# Patient Record
Sex: Female | Born: 2000 | Race: White | Hispanic: No | Marital: Single | Smoking: Former smoker
Health system: Southern US, Community
[De-identification: ages and names within clinical notes are randomized; demographics above are authoritative.]

## PROBLEM LIST (undated history)

## (undated) DIAGNOSIS — J301 Allergic rhinitis due to pollen: Secondary | ICD-10-CM

## (undated) DIAGNOSIS — R109 Unspecified abdominal pain: Secondary | ICD-10-CM

## (undated) DIAGNOSIS — R768 Other specified abnormal immunological findings in serum: Secondary | ICD-10-CM

## (undated) DIAGNOSIS — F32A Depression, unspecified: Secondary | ICD-10-CM

## (undated) DIAGNOSIS — F419 Anxiety disorder, unspecified: Secondary | ICD-10-CM

## (undated) DIAGNOSIS — T7840XA Allergy, unspecified, initial encounter: Secondary | ICD-10-CM

## (undated) HISTORY — DX: Other specified abnormal immunological findings in serum: R76.8

## (undated) HISTORY — DX: Unspecified abdominal pain: R10.9

## (undated) HISTORY — DX: Allergy, unspecified, initial encounter: T78.40XA

## (undated) HISTORY — DX: Allergic rhinitis due to pollen: J30.1

---
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
## 2021-02-25 ENCOUNTER — Telehealth: Payer: No Typology Code available for payment source | Admitting: Family

## 2021-03-03 ENCOUNTER — Telehealth: Payer: Self-pay

## 2021-03-03 NOTE — Telephone Encounter (Signed)
Lvm to reschedule recent follow up that was a no show with adolescent medicine FNP.

## 2021-03-04 ENCOUNTER — Emergency Department (HOSPITAL_BASED_OUTPATIENT_CLINIC_OR_DEPARTMENT_OTHER)
Admission: EM | Admit: 2021-03-04 | Discharge: 2021-03-05 | Disposition: A | Discharge status: Home / Self Care | Payer: No Typology Code available for payment source | Attending: Emergency Medicine | Admitting: Emergency Medicine

## 2021-03-04 ENCOUNTER — Other Ambulatory Visit: Payer: Self-pay

## 2021-03-04 ENCOUNTER — Encounter (HOSPITAL_BASED_OUTPATIENT_CLINIC_OR_DEPARTMENT_OTHER): Payer: Self-pay | Admitting: Emergency Medicine

## 2021-03-04 DIAGNOSIS — R519 Headache, unspecified: Secondary | ICD-10-CM | POA: Diagnosis present

## 2021-03-04 DIAGNOSIS — N9489 Other specified conditions associated with female genital organs and menstrual cycle: Secondary | ICD-10-CM | POA: Insufficient documentation

## 2021-03-04 MED ORDER — SODIUM CHLORIDE 0.9 % IV BOLUS
1000.0000 mL | Freq: Once | INTRAVENOUS | Status: AC
  Administered 2021-03-05: 1000 mL via INTRAVENOUS

## 2021-03-04 MED ORDER — METOCLOPRAMIDE HCL 5 MG/ML IJ SOLN
10.0000 mg | Freq: Once | INTRAMUSCULAR | Status: AC
  Administered 2021-03-05: 10 mg via INTRAVENOUS
  Filled 2021-03-04: qty 2

## 2021-03-04 MED ORDER — DEXAMETHASONE SODIUM PHOSPHATE 10 MG/ML IJ SOLN
10.0000 mg | Freq: Once | INTRAMUSCULAR | Status: AC
  Administered 2021-03-05: 10 mg via INTRAVENOUS
  Filled 2021-03-04: qty 1

## 2021-03-04 MED ORDER — DIPHENHYDRAMINE HCL 50 MG/ML IJ SOLN
25.0000 mg | Freq: Once | INTRAMUSCULAR | Status: AC
  Administered 2021-03-05: 25 mg via INTRAVENOUS
  Filled 2021-03-04: qty 1

## 2021-03-04 MED ORDER — KETOROLAC TROMETHAMINE 30 MG/ML IJ SOLN
30.0000 mg | Freq: Once | INTRAMUSCULAR | Status: AC
  Administered 2021-03-05: 30 mg via INTRAVENOUS
  Filled 2021-03-04: qty 1

## 2021-03-04 NOTE — ED Triage Notes (Signed)
Presents for bilateral temporal and occipital HA (8/10) x 10 days. Has been to PCP who prescribed amoxicillin for sinus infxn, and later wrote referral for neuro. Unable to obtain neuro appt for additional 2 weeks. Endorses photophobia.  Denies, fever, chills, N/V/D, vision change.  Has tried tylenol, not on any medications for migraines, not formally diagnosed with this.

## 2021-03-04 NOTE — ED Provider Notes (Signed)
[facility] EMERGENCY DEPT Provider Note   CSN: [id number] Arrival date & time: 03/04/21  2236     History  Chief Complaint  Patient presents with   Headache    Kimberly Nichols is a 21 y.o. female.  Patient is a 21 year old female with no significant past medical history.  She presents with a 1 week history of persistent headache.  She describes pain to both temples that radiates to the back of her head.  This has been constant and began in the absence of any injury or trauma.  She denies any blurry vision, but does report photophobia.  She denies any weakness or numbness.  She denies any fevers, chills, or stiff neck.  She has tried taking Tylenol and over-the-counter medications with little relief.  She was seen by her primary doctor and prescribed amoxicillin for possible sinus infection, however this did not help.  She has been referred to neurology, however this appointment is unable to take place for another 2 weeks.  The history is provided by the patient.  Headache Pain location:  L temporal and R temporal Quality:  Dull Onset quality:  Gradual Duration:  1 week Timing:  Constant Progression:  Worsening Chronicity:  New Relieved by:  Nothing Worsened by:  Nothing Ineffective treatments:  Acetaminophen Associated symptoms: no fever       Home Medications Prior to Admission medications   Medication Sig Start Date End Date Taking? Authorizing Provider  DULoxetine (CYMBALTA) 20 MG capsule Take 1 capsule (20 mg total) by mouth daily. 11/28/20   Georges Mouse, NP      Allergies    Clarithromycin    Review of Systems   Review of Systems  Constitutional:  Negative for fever.  Neurological:  Positive for headaches.  All other systems reviewed and are negative.  Physical Exam Updated Vital Signs BP 114/80    Pulse 95    Temp 99.2 F (37.3 C)    Resp 16    Ht 5\' 7"  (1.702 m)    Wt 77.1 kg    LMP 02/22/2021 (Exact Date)    SpO2 98%    BMI 26.63 kg/m   Physical Exam Vitals and nursing note reviewed.  Constitutional:      General: She is not in acute distress.    Appearance: She is well-developed. She is not diaphoretic.  HENT:     Head: Normocephalic and atraumatic.  Eyes:     General: No visual field deficit.    Extraocular Movements: Extraocular movements intact.     Pupils: Pupils are equal, round, and reactive to light.  Cardiovascular:     Rate and Rhythm: Normal rate and regular rhythm.     Heart sounds: No murmur heard.   No friction rub. No gallop.  Pulmonary:     Effort: Pulmonary effort is normal. No respiratory distress.     Breath sounds: Normal breath sounds. No wheezing.  Abdominal:     General: Bowel sounds are normal. There is no distension.     Palpations: Abdomen is soft.     Tenderness: There is no abdominal tenderness.  Musculoskeletal:        General: Normal range of motion.     Cervical back: Normal range of motion and neck supple.  Skin:    General: Skin is warm and dry.  Neurological:     General: No focal deficit present.     Mental Status: She is alert and oriented to person, place, and time.  Cranial Nerves: No cranial nerve deficit or facial asymmetry.    ED Results / Procedures / Treatments   Labs (all labs ordered are listed, but only abnormal results are displayed) Labs Reviewed  BASIC METABOLIC PANEL  CBC WITH DIFFERENTIAL/PLATELET  HCG, SERUM, QUALITATIVE    EKG None  Radiology No results found.  Procedures Procedures    Medications Ordered in ED Medications  sodium chloride 0.9 % bolus 1,000 mL (has no administration in time range)  ketorolac (TORADOL) 30 MG/ML injection 30 mg (has no administration in time range)  diphenhydrAMINE (BENADRYL) injection 25 mg (has no administration in time range)  metoCLOPramide (REGLAN) injection 10 mg (has no administration in time range)  dexamethasone (DECADRON) injection 10 mg (has no administration in time range)    ED Course/  Medical Decision Making/ A&P  Patient presenting with complaints of headache as described in the HPI.  She is neurologically intact and head CT is negative.  She is feeling better after migraine cocktail.  At this point, I doubt an emergent situation and do not feel as though LP is indicated.  Patient to be discharged with Tylenol/ibuprofen and follow-up with her primary doctor/neurologist as scheduled.  Final Clinical Impression(s) / ED Diagnoses Final diagnoses:  None    Rx / DC Orders ED Discharge Orders     None         Geoffery Lyons, MD 03/05/21 [phone]

## 2021-03-05 ENCOUNTER — Emergency Department (HOSPITAL_BASED_OUTPATIENT_CLINIC_OR_DEPARTMENT_OTHER): Payer: No Typology Code available for payment source

## 2021-03-05 LAB — HCG, SERUM, QUALITATIVE: Preg, Serum: NEGATIVE

## 2021-03-05 LAB — CBC WITH DIFFERENTIAL/PLATELET
Abs Immature Granulocytes: 0.02 10*3/uL (ref 0.00–0.07)
Basophils Absolute: 0 10*3/uL (ref 0.0–0.1)
Basophils Relative: 0 %
Eosinophils Absolute: 0.4 10*3/uL (ref 0.0–0.5)
Eosinophils Relative: 5 %
HCT: 36.9 % (ref 36.0–46.0)
Hemoglobin: 12.1 g/dL (ref 12.0–15.0)
Immature Granulocytes: 0 %
Lymphocytes Relative: 47 %
Lymphs Abs: 4.2 10*3/uL — ABNORMAL HIGH (ref 0.7–4.0)
MCH: 29.8 pg (ref 26.0–34.0)
MCHC: 32.8 g/dL (ref 30.0–36.0)
MCV: 90.9 fL (ref 80.0–100.0)
Monocytes Absolute: 1.1 10*3/uL — ABNORMAL HIGH (ref 0.1–1.0)
Monocytes Relative: 12 %
Neutro Abs: 3.3 10*3/uL (ref 1.7–7.7)
Neutrophils Relative %: 36 %
Platelets: 249 10*3/uL (ref 150–400)
RBC: 4.06 MIL/uL (ref 3.87–5.11)
RDW: 12 % (ref 11.5–15.5)
WBC: 9 10*3/uL (ref 4.0–10.5)
nRBC: 0 % (ref 0.0–0.2)

## 2021-03-05 LAB — BASIC METABOLIC PANEL
Anion gap: 7 (ref 5–15)
BUN: 17 mg/dL (ref 6–20)
CO2: 26 mmol/L (ref 22–32)
Calcium: 9.7 mg/dL (ref 8.9–10.3)
Chloride: 106 mmol/L (ref 98–111)
Creatinine, Ser: 0.76 mg/dL (ref 0.44–1.00)
GFR, Estimated: >60 mL/min (ref >60)
Glucose, Bld: 92 mg/dL (ref 70–99)
Potassium: 3.8 mmol/L (ref 3.5–5.1)
Sodium: 139 mmol/L (ref 135–145)

## 2021-03-05 NOTE — Discharge Instructions (Signed)
Take Tylenol 1000 mg rotated with ibuprofen 600 mg every 4 hours as needed for pain.    Follow-up with your primary doctor/neurologist as scheduled, and return to the ER if symptoms significantly worsen or change.

## 2022-01-04 ENCOUNTER — Telehealth: Payer: Self-pay | Admitting: Family Medicine

## 2022-01-04 NOTE — Telephone Encounter (Signed)
Pt's Mother- Dagmar Hait - current Pt of Dr. Caryl Never) called to ask if MD would be willing to take her daughter on as a new patient?  Pt has [employer] education [employer].

## 2022-01-05 NOTE — Telephone Encounter (Signed)
Pt will be here on 01/11/22

## 2022-01-11 ENCOUNTER — Ambulatory Visit (INDEPENDENT_AMBULATORY_CARE_PROVIDER_SITE_OTHER): Payer: No Typology Code available for payment source | Admitting: Family Medicine

## 2022-01-11 ENCOUNTER — Encounter: Payer: Self-pay | Admitting: Family Medicine

## 2022-01-11 VITALS — BP 110/62 | HR 74 | Temp 98.0°F | Ht 67.72 in | Wt 189.2 lb

## 2022-01-11 DIAGNOSIS — R635 Abnormal weight gain: Secondary | ICD-10-CM

## 2022-01-11 DIAGNOSIS — M545 Low back pain, unspecified: Secondary | ICD-10-CM

## 2022-01-11 DIAGNOSIS — F339 Major depressive disorder, recurrent, unspecified: Secondary | ICD-10-CM | POA: Diagnosis not present

## 2022-01-11 NOTE — Progress Notes (Unsigned)
21 y.o. No obstetric history on file. Single Caucasian female here for NEW GYN/annual exam.  Pt wants to discuss birth control options.   Cramping with the last 4 cycles, severe for first day or two associated with heavy bleeding. Has a headache before her cycle starts.   Sexually active.  Using condoms.   Also having acne which she would like to control.  Denies HTN, migraine with aura, liver or breast disease, personal or family history of thromboembolic events.   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   Had negative of GC/CT from urine with [facility] pediatrics last week.  She states it was negative.   PCP:   Dr. Caryl Never  Patient's last menstrual period was 01/14/2022.     Period Cycle (Days): 28 Period Duration (Days): 4 Period Pattern: Regular Menstrual Flow: Moderate Menstrual Control: Tampon Dysmenorrhea: (!) Severe (Severed first two days, Pt says this started recently)     Sexually active: Yes.    The current method of family planning is none.    Exercising: Yes.    Gym/ health club routine includes light weights. Smoker:  no  Health Maintenance: Pap:  n/a History of abnormal Pap:  no MMG:  n/a Colonoscopy:  n/a BMD:   n/a  Result  n/a TDaP:  pt is unsure when but she has had it done Gardasil:   yes, completed HIV: n/a Hep C: n/a Screening Labs:  Hb today: ***, Urine today: ***   reports that she has quit smoking. Her smoking use included e-cigarettes. She has never used smokeless tobacco. She reports that she does not drink alcohol and does not use drugs.  Past Medical History:  Diagnosis Date   Abdominal pain, recurrent    Allergy    Anxiety    Depression    Hay fever    Helicobacter pylori antibody above reference range     Past Surgical History:  Procedure Laterality Date   TONSILLECTOMY AND ADENOIDECTOMY N/A 10/05/2019   Procedure: TONSILLECTOMY AND  ADENOIDECTOMY;  Surgeon: Newman Pies, MD;  Location: [facility];  Service: ENT;  Laterality: N/A;    Current Outpatient Medications  Medication Sig Dispense Refill   DULoxetine (CYMBALTA) 20 MG capsule Take 1 capsule (20 mg total) by mouth daily. (Patient not taking: Reported on 01/11/2022) 90 capsule 1   No current facility-administered medications for this visit.    Family History  Problem Relation Age of Onset   Anxiety disorder Mother    COPD Maternal Grandmother    Cancer Maternal Grandmother    Stroke Maternal Grandfather    Depression Maternal Grandfather    Heart attack Maternal Grandfather     Review of Systems  All other systems reviewed and are negative.   Exam:   BP 122/80 (BP Location: Right Arm, Patient Position: Sitting, Cuff Size: Normal)   Pulse 74   Ht 5' 6.75" (1.695 m)   Wt 190 lb (86.2 kg)   LMP 01/14/2022   SpO2 100%   BMI 29.98 kg/m     General appearance: alert, cooperative and appears stated age Head: normocephalic, without obvious abnormality, atraumatic Neck: no adenopathy, supple, symmetrical, trachea midline and thyroid normal to inspection and palpation Lungs: clear to auscultation bilaterally Breasts: normal appearance, no masses or tenderness, No nipple retraction or dimpling, No nipple discharge or bleeding, No axillary adenopathy Heart: regular rate and rhythm Abdomen: soft, non-tender; no masses,  no organomegaly Extremities: extremities normal, atraumatic, no cyanosis or edema Skin: skin color, texture, turgor normal. No rashes or lesions Lymph nodes: cervical, supraclavicular, and axillary nodes normal. Neurologic: grossly normal  Pelvic: External genitalia:  no lesions              No abnormal inguinal nodes palpated.              Urethra:  normal appearing urethra with no masses, tenderness or lesions              Bartholins and Skenes: normal                 Vagina: normal appearing vagina with normal color and  discharge, no lesions              Cervix: no lesions              Pap taken: {yes no:314532} Bimanual Exam:  Uterus:  normal size, contour, position, consistency, mobility, non-tender              Adnexa: no mass, fullness, tenderness              Rectal exam: {yes no:314532}.  Confirms.              Anus:  normal sphincter tone, no lesions  Chaperone was present for exam:  ***  Assessment:   Well woman visit with gynecologic exam.   Plan: Mammogram screening discussed. Self breast awareness reviewed. Pap and HR HPV as above. Guidelines for Calcium, Vitamin D, regular exercise program including cardiovascular and weight bearing exercise. We discussed pills, patch, rings, and IUDs.  We focused on the [place] IUD. STD screening.   Follow up annually and prn.   Additional counseling given.  {yes [id number]. _______ minutes face to face time of which over 50% was spent in counseling.    After visit summary provided.

## 2022-01-11 NOTE — Progress Notes (Signed)
Established Patient Office Visit  Subjective   Patient ID: Kimberly Nichols, female    DOB: 2001-01-16  Age: 21 y.o. MRN: [id number]  Chief Complaint  Patient presents with   New Patient (Initial Visit)         HPI   [name] is seen to establish care.  Has previously been seen by [facility] pediatrics.  In fact, she was just seen there she states a week ago with UTI diagnosis.  She was treated with Septra DS and symptoms improved at this time.  She is sexually active with 1 partner.  She uses barrier protection.  She does recall that chlamydia testing was done which came back negative.  We reviewed her previous immunizations and these are all completely up-to-date with exception of flu vaccine this season which she declines.  She has history of recurrent depression.  Has been on Cymbalta until this past year.  She had tried multiple other antidepressants previously.  She states that she was on Cymbalta for anxiety and depression symptoms and that seemed to help but she feels stable currently off medication.  She has had some recent bilateral low back pain.  No radiculitis symptoms.  Denies any injury.  She just joined the gym and plans to start back regular exercise program soon.  She had some recent weight gain.  She plans to make some dietary changes as well.  She works at [employer] and frequently eats high fat foods there.  She is currently taking classes at [employer] college and trying to get into Armed forces operational officer school.  Past medical history otherwise significant for previous tonsillectomy 2021.  No other surgeries.  Reported allergy/intolerance to clarithromycin.  Takes no regular medications currently.  Family history significant for obesity in her mother.  No type 2 diabetes.  No premature heart disease.  She is non-smoker.  Past Medical History:  Diagnosis Date   Abdominal pain, recurrent    Allergy    Anxiety    Depression    Hay fever     Helicobacter pylori antibody above reference range    Past Surgical History:  Procedure Laterality Date   TONSILLECTOMY AND ADENOIDECTOMY N/A 10/05/2019   Procedure: TONSILLECTOMY AND ADENOIDECTOMY;  Surgeon: Newman Pies, MD;  Location: [facility];  Service: ENT;  Laterality: N/A;    reports that she has been smoking e-cigarettes. She has never used smokeless tobacco. She reports that she does not drink alcohol and does not use drugs. family history includes Anxiety disorder in her mother; COPD in her maternal grandmother; Cancer in her maternal grandmother; Depression in her maternal grandfather; Heart attack in her maternal grandfather; Stroke in her maternal grandfather. Allergies  Allergen Reactions   Clarithromycin     Review of Systems  Constitutional:  Negative for malaise/fatigue.  Eyes:  Negative for blurred vision.  Respiratory:  Negative for shortness of breath.   Cardiovascular:  Negative for chest pain.  Gastrointestinal:  Negative for abdominal pain.  Genitourinary:  Negative for dysuria.  Neurological:  Negative for dizziness, weakness and headaches.      Objective:     BP 110/62 (BP Location: Left Arm, Patient Position: Sitting, Cuff Size: Normal)   Pulse 74   Temp 98 F (36.7 C) (Oral)   Ht 5' 7.72" (1.72 m)   Wt 189 lb 3.2 oz (85.8 kg)   SpO2 99%   BMI 29.01 kg/m  BP Readings from Last 3 Encounters:  01/11/22 110/62  03/05/21 104/67  11/28/20 111/78   Wt Readings from Last 3 Encounters:  01/11/22 189 lb 3.2 oz (85.8 kg)  03/04/21 170 lb (77.1 kg) (92 %, Z= 1.38)*  11/28/20 168 lb 12.8 oz (76.6 kg) (91 %, Z= 1.37)*   * Growth percentiles are based on CDC (Girls, 2-20 Years) data.      Physical Exam Vitals reviewed.  Cardiovascular:     Rate and Rhythm: Normal rate and regular rhythm.     Heart sounds: No murmur heard. Pulmonary:     Effort: Pulmonary effort is normal.     Breath sounds: Normal breath sounds. No wheezing or rales.   Musculoskeletal:     Right lower leg: No edema.     Left lower leg: No edema.     Comments: Straight leg raises are negative bilaterally  Neurological:     Mental Status: She is alert.     Comments: Full strength lower extremities      No results found for any visits on 01/11/22.    The ASCVD Risk score (Arnett DK, et al., 2019) failed to calculate for the following reasons:   The 2019 ASCVD risk score is only valid for ages 46 to 57    Assessment & Plan:   #1 history of recurrent depression.  Currently stable off medications.  Has previously taken Cymbalta.  Be in touch for any recurrent symptoms  #2 recent weight gain.  Her weight has gone up substantially past couple years.  We discussed diet and exercise in some detail.  Scale back high caloric foods and establish consistent regular exercise.  Not having any success with the above consider at least checking thyroid functions  #3 reported recent UTI.  Patient treated with Septra.  STDs ruled out per patient.  Patient improved following antibiotics  #4 bilateral lumbar back pain.  Nonfocal exam.  Recommend core strengthening and some back stretches.  Consider trial of physical therapy if not improved in the next couple weeks  #5 health maintenance-she is scheduled to see GYN next week to discuss getting initial Pap smear and possibly birth control options.  Flu vaccine offered but declined.  Other immunizations up-to-date.  No follow-ups on file.    Evelena Peat, MD

## 2022-01-18 ENCOUNTER — Other Ambulatory Visit (HOSPITAL_COMMUNITY)
Admission: RE | Admit: 2022-01-18 | Discharge: 2022-01-18 | Disposition: A | Discharge status: Home / Self Care | Payer: No Typology Code available for payment source | Source: Ambulatory Visit | Attending: Obstetrics and Gynecology | Admitting: Obstetrics and Gynecology

## 2022-01-18 ENCOUNTER — Ambulatory Visit (INDEPENDENT_AMBULATORY_CARE_PROVIDER_SITE_OTHER): Payer: No Typology Code available for payment source | Admitting: Obstetrics and Gynecology

## 2022-01-18 ENCOUNTER — Encounter: Payer: Self-pay | Admitting: Obstetrics and Gynecology

## 2022-01-18 VITALS — BP 122/80 | HR 74 | Ht 66.75 in | Wt 190.0 lb

## 2022-01-18 DIAGNOSIS — Z308 Encounter for other contraceptive management: Secondary | ICD-10-CM

## 2022-01-18 DIAGNOSIS — Z113 Encounter for screening for infections with a predominantly sexual mode of transmission: Secondary | ICD-10-CM | POA: Insufficient documentation

## 2022-01-18 DIAGNOSIS — Z114 Encounter for screening for human immunodeficiency virus [HIV]: Secondary | ICD-10-CM | POA: Diagnosis not present

## 2022-01-18 DIAGNOSIS — Z01419 Encounter for gynecological examination (general) (routine) without abnormal findings: Secondary | ICD-10-CM

## 2022-01-18 DIAGNOSIS — Z1159 Encounter for screening for other viral diseases: Secondary | ICD-10-CM

## 2022-01-18 MED ORDER — MISOPROSTOL 200 MCG PO TABS: ORAL_TABLET | ORAL | 0 refills | Status: DC

## 2022-01-18 NOTE — Patient Instructions (Signed)
Intrauterine Device You will learn what IUDs are, their length of use, and side effects. To view the content, go to this web address: [email or web address]  This video will expire on: 10/14/2023. If you need access to this video following this date, please reach out to the healthcare provider who assigned it to you. This information is not intended to replace advice given to you by your health care provider. Make sure you discuss any questions you have with your health care provider. Elsevier Patient Education  2023 Elsevier Inc.   Intrauterine Device Information An intrauterine device (IUD) is a medical device that is inserted into the uterus to prevent pregnancy. It is a small, T-shaped device that has one or two nylon strings hanging down from it. The strings hang out of the lower part of the uterus (cervix) to allow for future IUD removal. There are two types of IUDs: Hormone IUD. This type of IUD is made of plastic and contains the hormone progestin (synthetic progesterone). A hormone IUD may last 3-5 years. Copper IUD. This type of IUD has copper wire wrapped around it. A copper IUD may last up to 10 years. How is an IUD inserted? An IUD is inserted through the vagina, through the cervix, and into the uterus with a minor medical procedure. The procedure for IUD insertion may vary among health care providers and hospitals. How does an IUD work? Synthetic progesterone in a hormonal IUD prevents pregnancy by: Thickening cervical mucus to prevent sperm from entering the uterus. Thinning the uterine lining to prevent a fertilized egg from being implanted there. Copper in a copper IUD prevents pregnancy by making the uterus and fallopian tubes produce a fluid that kills sperm. What are the advantages of an IUD? Advantages of either type of IUD An IUD: Is highly effective in preventing pregnancy. Is reversible. You can become pregnant shortly after the IUD is removed. Is  low-maintenance and can stay in place for a long time. Has no estrogen-related side effects. Can be used when breastfeeding. Is not associated with weight gain. Can be inserted right after childbirth, an abortion, or a miscarriage. Advantages of a hormone IUD If it is inserted within 7 days of your period starting, it works right after it has been inserted. If the hormone IUD is inserted at any other time in your cycle, you will need to use a backup method of birth control for 7 days after insertion. It can make menstrual periods lighter or stop completely. It can reduce menstrual cramping and other discomforts from menstrual periods. It can be used for 3-5 years, depending on which IUD you have. Advantages of a copper IUD It works right after it is inserted. It can be used as a form of emergency birth control if it is inserted within 5 days after having unprotected sex. It does not interfere with your body's natural hormones. It can be used for up to 10 years. What are the disadvantages of an IUD? An IUD may cause irregular menstrual bleeding for a period of time after insertion. It is common to have pain during insertion and have cramping and vaginal bleeding after insertion. An IUD may cut the uterus (uterine perforation) when it is inserted. This is rare. Pelvic inflammatory disease (PID) may happen after insertion of an IUD. PID is an infection in the uterus and fallopian tubes. The IUD does not cause the infection. The infection is usually from an unknown sexually transmitted infection (STI). This is rare, and it usually happens  during the first 20 days after the IUD is inserted. A copper IUD can make your menstrual flow heavier and more painful. IUDs cannot prevent sexually transmitted infections (STIs). How is an IUD removed?  You will lie on your back with your knees bent and your feet in footrests (stirrups). A device will be inserted into your vagina to spread apart the vaginal  walls (speculum). This will allow your health care provider to see the strings attached to the IUD. Your health care provider will use a small instrument (forceps) to grasp the IUD strings and will pull firmly until the IUD is removed. You may have some discomfort when the IUD is removed. Your health care provider may recommend taking over-the-counter pain relievers, such as ibuprofen, before the procedure. You may also have minor spotting for a few days after the procedure. The procedure for IUD removal may vary among health care providers and hospitals. Is an IUD right for me? If you are interested in an IUD, discuss it with your health care provider. He or she will make sure you are a good candidate for an IUD and will let you know more about the advantages, disadvantage, and possible side effects. This will allow you to make a decision about the device. Summary An intrauterine device (IUD) is a medical device that is inserted in the uterus to prevent pregnancy. It is a small, T-shaped device that has one or two nylon strings hanging down from it. A hormone IUD contains the hormone progestin (synthetic progesterone). A copper IUD has copper wire wrapped around it. Synthetic progesterone in a hormone IUD prevents pregnancy by thickening cervical mucus and thinning the walls of the uterus. Copper in a copper IUD prevents pregnancy by making the uterus and fallopian tubes produce a fluid that kills sperm. A hormone IUD can be left in place for 3-5 years. A copper IUD can be left in place for up to 10 years. An IUD is inserted and removed by a health care provider. You may feel some pain during insertion and removal. Your health care provider may recommend taking over-the-counter pain medicine, such as ibuprofen, before an IUD procedure. This information is not intended to replace advice given to you by your health care provider. Make sure you discuss any questions you have with your health care  provider. Document Revised: 08/08/2019 Document Reviewed: 08/08/2019 Elsevier Patient Education  2023 Elsevier Inc.   EXERCISE AND DIET:  We recommended that you start or continue a regular exercise program for good health. Regular exercise means any activity that makes your heart beat faster and makes you sweat.  We recommend exercising at least 30 minutes per day at least 3 days a week, preferably 4 or 5.  We also recommend a diet low in fat and sugar.  Inactivity, poor dietary choices and obesity can cause diabetes, heart attack, stroke, and kidney damage, among others.    ALCOHOL AND SMOKING:  Women should limit their alcohol intake to no more than 7 drinks/beers/glasses of wine (combined, not each!) per week. Moderation of alcohol intake to this level decreases your risk of breast cancer and liver damage. And of course, no recreational drugs are part of a healthy lifestyle.  And absolutely no smoking or even second hand smoke. Most people know smoking can cause heart and lung diseases, but did you know it also contributes to weakening of your bones? Aging of your skin?  Yellowing of your teeth and nails?  CALCIUM AND VITAMIN D:  Adequate intake of calcium and Vitamin D are recommended.  The recommendations for exact amounts of these supplements seem to change often, but generally speaking 600 mg of calcium (either carbonate or citrate) and 800 units of Vitamin D per day seems prudent. Certain women may benefit from higher intake of Vitamin D.  If you are among these women, your doctor will have told you during your visit.    PAP SMEARS:  Pap smears, to check for cervical cancer or precancers,  have traditionally been done yearly, although recent scientific advances have shown that most women can have pap smears less often.  However, every woman still should have a physical exam from her gynecologist every year. It will include a breast check, inspection of the vulva and vagina to check for abnormal  growths or skin changes, a visual exam of the cervix, and then an exam to evaluate the size and shape of the uterus and ovaries.  And after 20 years of age, a rectal exam is indicated to check for rectal cancers. We will also provide age appropriate advice regarding health maintenance, like when you should have certain vaccines, screening for sexually transmitted diseases, bone density testing, colonoscopy, mammograms, etc.   MAMMOGRAMS:  All women over 48 years old should have a yearly mammogram. Many facilities now offer a "3D" mammogram, which may cost around $50 extra out of pocket. If possible,  we recommend you accept the option to have the 3D mammogram performed.  It both reduces the number of women who will be called back for extra views which then turn out to be normal, and it is better than the routine mammogram at detecting truly abnormal areas.    COLONOSCOPY:  Colonoscopy to screen for colon cancer is recommended for all women at age 36.  We know, you hate the idea of the prep.  We agree, BUT, having colon cancer and not knowing it is worse!!  Colon cancer so often starts as a polyp that can be seen and removed at colonscopy, which can quite literally save your life!  And if your first colonoscopy is normal and you have no family history of colon cancer, most women don't have to have it again for 10 years.  Once every ten years, you can do something that may end up saving your life, right?  We will be happy to help you get it scheduled when you are ready.  Be sure to check your insurance coverage so you understand how much it will cost.  It may be covered as a preventative service at no cost, but you should check your particular policy.

## 2022-01-19 LAB — RPR: RPR Ser Ql: NONREACTIVE

## 2022-01-19 LAB — HIV ANTIBODY (ROUTINE TESTING W REFLEX): HIV 1&2 Ab, 4th Generation: NONREACTIVE

## 2022-01-19 LAB — CERVICOVAGINAL ANCILLARY ONLY
Comment: NEGATIVE
Trichomonas: NEGATIVE

## 2022-01-19 LAB — HEPATITIS C ANTIBODY: Hepatitis C Ab: NONREACTIVE

## 2022-02-04 NOTE — Progress Notes (Deleted)
GYNECOLOGY  VISIT   HPI: 21 y.o.   Single  Caucasian  female   [id number] with Patient's last menstrual period was 01/14/2022.   here for   Mirena Insertion  GYNECOLOGIC HISTORY: Patient's last menstrual period was 01/14/2022. Contraception:  n/a Menopausal hormone therapy:  n/a Last mammogram:  n/a Last pap smear:   n/a        OB History     Gravida  0   Para  0   Term  0   Preterm  0   AB  0   Living  0      SAB  0   IAB  0   Ectopic  0   Multiple  0   Live Births  0              Patient Active Problem List   Diagnosis Date Noted   Sleep disturbance 04/14/2020   Depression 03/17/2020   Migraine without aura and without status migrainosus, not intractable 02/14/2020   Generalized anxiety disorder 02/14/2020   Generalized abdominal pain    Helicobacter pylori antibody above reference range     Past Medical History:  Diagnosis Date   Abdominal pain, recurrent    Allergy    Anxiety    Depression    Hay fever    Helicobacter pylori antibody above reference range     Past Surgical History:  Procedure Laterality Date   TONSILLECTOMY AND ADENOIDECTOMY N/A 10/05/2019   Procedure: TONSILLECTOMY AND ADENOIDECTOMY;  Surgeon: Newman Pies, MD;  Location: [facility];  Service: ENT;  Laterality: N/A;    Current Outpatient Medications  Medication Sig Dispense Refill   DULoxetine (CYMBALTA) 20 MG capsule Take 1 capsule (20 mg total) by mouth daily. (Patient not taking: Reported on 01/11/2022) 90 capsule 1   misoprostol (CYTOTEC) 200 MCG tablet Place one tablet (200 mcg) per vagina the night before the IUD insertion, and then place one tablet in the vagina the morning of the IUD insertion. 2 tablet 0   No current facility-administered medications for this visit.     ALLERGIES: Clarithromycin  Family History  Problem Relation Age of Onset   Anxiety disorder Mother    COPD Maternal Grandmother    Cancer Maternal Grandmother    Stroke Maternal  Grandfather    Depression Maternal Grandfather    Heart attack Maternal Grandfather     Social History   Socioeconomic History   Marital status: Single    Spouse name: Not on file   Number of children: Not on file   Years of education: Not on file   Highest education level: Not on file  Occupational History   Not on file  Tobacco Use   Smoking status: Former    Types: E-cigarettes   Smokeless tobacco: Never  Substance and Sexual Activity   Alcohol use: No   Drug use: No   Sexual activity: Yes    Birth control/protection: Condom  Other Topics Concern   Not on file  Social History Narrative   Not on file   Social Determinants of Health   Financial Resource Strain: Not on file  Food Insecurity: Not on file  Transportation Needs: Not on file  Physical Activity: Not on file  Stress: Not on file  Social Connections: Not on file  Intimate Partner Violence: Not on file    Review of Systems  PHYSICAL EXAMINATION:    LMP 01/14/2022     General appearance: alert, cooperative and appears  stated age Head: Normocephalic, without obvious abnormality, atraumatic Neck: no adenopathy, supple, symmetrical, trachea midline and thyroid normal to inspection and palpation Lungs: clear to auscultation bilaterally Breasts: normal appearance, no masses or tenderness, No nipple retraction or dimpling, No nipple discharge or bleeding, No axillary or supraclavicular adenopathy Heart: regular rate and rhythm Abdomen: soft, non-tender, no masses,  no organomegaly Extremities: extremities normal, atraumatic, no cyanosis or edema Skin: Skin color, texture, turgor normal. No rashes or lesions Lymph nodes: Cervical, supraclavicular, and axillary nodes normal. No abnormal inguinal nodes palpated Neurologic: Grossly normal  Pelvic: External genitalia:  no lesions              Urethra:  normal appearing urethra with no masses, tenderness or lesions              Bartholins and Skenes: normal                  Vagina: normal appearing vagina with normal color and discharge, no lesions              Cervix: no lesions                Bimanual Exam:  Uterus:  normal size, contour, position, consistency, mobility, non-tender              Adnexa: no mass, fullness, tenderness              Rectal exam: {yes no:314532}.  Confirms.              Anus:  normal sphincter tone, no lesions  Chaperone was present for exam:  ***  ASSESSMENT     PLAN     An After Visit Summary was printed and given to the patient.  ______ minutes face to face time of which over 50% was spent in counseling.

## 2022-02-16 ENCOUNTER — Ambulatory Visit: Payer: No Typology Code available for payment source | Admitting: Obstetrics and Gynecology

## 2022-02-17 ENCOUNTER — Encounter: Payer: No Typology Code available for payment source | Admitting: Family Medicine

## 2022-03-08 ENCOUNTER — Encounter: Payer: Self-pay | Admitting: Family Medicine

## 2022-03-08 ENCOUNTER — Telehealth: Payer: Self-pay

## 2022-03-08 ENCOUNTER — Ambulatory Visit (INDEPENDENT_AMBULATORY_CARE_PROVIDER_SITE_OTHER): Payer: No Typology Code available for payment source | Admitting: Family Medicine

## 2022-03-08 VITALS — BP 118/70 | HR 110 | Temp 98.8°F | Ht 66.75 in | Wt 193.3 lb

## 2022-03-08 DIAGNOSIS — L0231 Cutaneous abscess of buttock: Secondary | ICD-10-CM | POA: Diagnosis not present

## 2022-03-08 DIAGNOSIS — L03317 Cellulitis of buttock: Secondary | ICD-10-CM

## 2022-03-08 MED ORDER — DOXYCYCLINE HYCLATE 100 MG PO CAPS: 100.0000 mg | ORAL_CAPSULE | Freq: Two times a day (BID) | ORAL | 0 refills | Status: DC

## 2022-03-08 NOTE — Patient Instructions (Signed)
Do some warm tub soaks with epsom salts twice daily  Start the antibiotic  Follow up for any fever or progressive redness or swelling.

## 2022-03-08 NOTE — Telephone Encounter (Signed)
Kimberly Nichols spoke with [name] at [facility] in regards to Wound culture taken today in office.  [name] informed Kimberly Nichols that the specimen stability of culture can be left room temperature of refrigerated for up to 48 hours. Specimen was placed in lab refrigerator due to our lab being closed for the afternoon.

## 2022-03-08 NOTE — Progress Notes (Signed)
Established Patient Office Visit  Subjective   Patient ID: Kimberly Nichols, female    DOB: August 24, 2000  Age: 22 y.o. MRN: [id number]  Chief Complaint  Patient presents with   Mass    Patient complains of mass on buttocks, x3 days     HPI   Seen with onset of some soreness and redness and swelling left buttock about 3 to 4 days ago.  Her mom sterilized a pen and tried to open this up some last night with minimal purulent drainage.  She had some soreness and had chills once last night but no documented fever.  Some surrounding redness today.  No known history of MRSA.  Past Medical History:  Diagnosis Date   Abdominal pain, recurrent    Allergy    Anxiety    Depression    Hay fever    Helicobacter pylori antibody above reference range    Past Surgical History:  Procedure Laterality Date   TONSILLECTOMY AND ADENOIDECTOMY N/A 10/05/2019   Procedure: TONSILLECTOMY AND ADENOIDECTOMY;  Surgeon: Leta Baptist, MD;  Location: [place];  Service: ENT;  Laterality: N/A;    reports that she has quit smoking. Her smoking use included e-cigarettes. She has never used smokeless tobacco. She reports that she does not drink alcohol and does not use drugs. family history includes Anxiety disorder in her mother; COPD in her maternal grandmother; Cancer in her maternal grandmother; Depression in her maternal grandfather; Heart attack in her maternal grandfather; Stroke in her maternal grandfather. Allergies  Allergen Reactions   Clarithromycin     Review of Systems  Constitutional:  Negative for chills and fever.  Gastrointestinal:  Negative for nausea and vomiting.      Objective:     BP 118/70 (BP Location: Left Arm, Patient Position: Sitting, Cuff Size: Normal)   Pulse (!) 110   Temp 98.8 F (37.1 C) (Oral)   Ht 5' 6.75" (1.695 m)   Wt 193 lb 4.8 oz (87.7 kg)   SpO2 99%   BMI 30.50 kg/m    Physical Exam Vitals reviewed.  Constitutional:      General: She  is not in acute distress.    Appearance: She is not toxic-appearing.  Cardiovascular:     Rate and Rhythm: Normal rate and regular rhythm.  Skin:    Comments: Left buttock reveals approximately 6 x 8 cm area of erythema.  Near the center there is some induration with small pustular center.  No fluctuance.  No necrotic tissue.  Neurological:     Mental Status: She is alert.      No results found for any visits on 03/08/22.    The ASCVD Risk score (Arnett DK, et al., 2019) failed to calculate for the following reasons:   The 2019 ASCVD risk score is only valid for ages 15 to 38    Assessment & Plan:   Problem List Items Addressed This Visit   None Visit Diagnoses     Cellulitis and abscess of buttock    -  Primary   Relevant Orders   WOUND CULTURE     She has abscess and some surrounding cellulitis left buttock.  No known history of MRSA.  Patient nontoxic in appearance.  No indication for I&D at this time.  She has no fluctuance.  This is spontaneously draining some.  We recommend the following:  -Wound culture obtained -Start doxycycline 100 mg twice daily for 10 days -Recommend warm tub soaks with Epsom salts  twice daily -Follow-up immediately for any fever or increasing erythema or other concerns  No follow-ups on file.    Carolann Littler, MD

## 2022-03-12 ENCOUNTER — Telehealth: Payer: Self-pay | Admitting: Family Medicine

## 2022-03-12 LAB — WOUND CULTURE
MICRO NUMBER:: 14493136
SPECIMEN QUALITY:: ADEQUATE

## 2022-03-12 MED ORDER — SULFAMETHOXAZOLE-TRIMETHOPRIM 800-160 MG PO TABS: 1.0000 | ORAL_TABLET | Freq: Two times a day (BID) | ORAL | 0 refills | Status: AC

## 2022-03-12 NOTE — Telephone Encounter (Signed)
Please see result note 

## 2022-03-12 NOTE — Telephone Encounter (Signed)
Pt called, returning CMA's call. CMA was unavailable. Pt asked that CMA call back at his earliest convenience. 

## 2022-03-12 NOTE — Addendum Note (Signed)
Addended by: Rosalee Kaufman L on: 03/12/2022 05:00 PM   Modules accepted: Orders

## 2022-04-21 ENCOUNTER — Encounter: Payer: Self-pay | Admitting: Family Medicine

## 2022-04-21 ENCOUNTER — Ambulatory Visit: Payer: No Typology Code available for payment source | Admitting: Family Medicine

## 2022-04-21 VITALS — BP 102/70 | HR 100 | Temp 98.9°F | Ht 66.75 in | Wt 191.9 lb

## 2022-04-21 DIAGNOSIS — L0231 Cutaneous abscess of buttock: Secondary | ICD-10-CM | POA: Diagnosis not present

## 2022-04-21 DIAGNOSIS — Z22322 Carrier or suspected carrier of Methicillin resistant Staphylococcus aureus: Secondary | ICD-10-CM | POA: Diagnosis not present

## 2022-04-21 DIAGNOSIS — L03317 Cellulitis of buttock: Secondary | ICD-10-CM | POA: Diagnosis not present

## 2022-04-21 MED ORDER — CHLORHEXIDINE GLUCONATE 4 % EX LIQD: Freq: Every day | CUTANEOUS | 1 refills | Status: AC | PRN

## 2022-04-21 MED ORDER — CLINDAMYCIN HCL 300 MG PO CAPS: 300.0000 mg | ORAL_CAPSULE | Freq: Four times a day (QID) | ORAL | 0 refills | Status: AC

## 2022-04-21 NOTE — Patient Instructions (Addendum)
Hibiclens washes-- daily for 7-14 days-- wait until the end of your antibiotic course  1-2 teaspoons of bleach in your washing machine-- use the hottest water available for washing

## 2022-04-21 NOTE — Progress Notes (Signed)
   Acute Office Visit  Subjective:     Patient ID: Kimberly Nichols, female    DOB: Nov 22, 2000, 22 y.o.   MRN: [id number]  Chief Complaint  Patient presents with   Cyst    Patient complains of recurrent pimple  on the left buttock x2 days    HPI Patient is in today for a new red spot on her buttock. States that she was told it was MRSA and was given antibiotics. States that this is the third time it has recurred, nearly in the spot, not so much painful or tender. States that the antibiotics did help her the first two times.   Review of Systems  Constitutional:  Negative for chills and fever.  Gastrointestinal:  Negative for nausea.        Objective:    BP 102/70 (BP Location: Left Arm, Patient Position: Sitting, Cuff Size: Large)   Pulse 100   Temp 98.9 F (37.2 C) (Oral)   Ht 5' 6.75" (1.695 m)   Wt 191 lb 14.4 oz (87 kg)   LMP 04/07/2022 (Approximate)   SpO2 98%   BMI 30.28 kg/m    Physical Exam Vitals reviewed.  Constitutional:      Appearance: Normal appearance. She is normal weight.  Skin:    General: Skin is warm.     Comments: Left medial buttock there is an area of erythema and induration, there are two healed spots superiorly to the current affected area. There is a small opening in the middle, no purulence noted, no abscess is palpated  Neurological:     General: No focal deficit present.     Mental Status: She is alert and oriented to person, place, and time. Mental status is at baseline.     No results found for any visits on 04/21/22.      Assessment & Plan:   Problem List Items Addressed This Visit   None Visit Diagnoses     Cellulitis and abscess of buttock    -  Primary   Relevant Medications   clindamycin (CLEOCIN) 300 MG capsule   MRSA (methicillin resistant staph aureus) culture positive       Relevant Medications   chlorhexidine (HIBICLENS) 4 % external liquid   clindamycin (CLEOCIN) 300 MG capsule     This is the third  infection that pt has sustained. She is asking about decolonization and I advised using hibiclens washes. Will treat according to the culture, switching abx to clindamycin 300 mg QID for 7 days. No abscess to drain today.   Meds ordered this encounter  Medications   chlorhexidine (HIBICLENS) 4 % external liquid    Sig: Apply topically daily as needed for up to 14 days.    Dispense:  473 mL    Refill:  1   clindamycin (CLEOCIN) 300 MG capsule    Sig: Take 1 capsule (300 mg total) by mouth in the morning, at noon, in the evening, and at bedtime for 7 days.    Dispense:  28 capsule    Refill:  0    No follow-ups on file.  Farrel Conners, MD

## 2022-05-04 NOTE — Progress Notes (Unsigned)
   ACUTE VISIT No chief complaint on file.  HPI: [name] is a 22 y.o. female, who is here today complaining of *** HPI  Review of Systems See other pertinent positives and negatives in HPI.  Current Outpatient Medications on File Prior to Visit  Medication Sig Dispense Refill   chlorhexidine (HIBICLENS) 4 % external liquid Apply topically daily as needed for up to 14 days. 473 mL 1   DULoxetine (CYMBALTA) 20 MG capsule Take 1 capsule (20 mg total) by mouth daily. 90 capsule 1   No current facility-administered medications on file prior to visit.    Past Medical History:  Diagnosis Date   Abdominal pain, recurrent    Allergy    Anxiety    Depression    Hay fever    Helicobacter pylori antibody above reference range    Allergies  Allergen Reactions   Clarithromycin     Social History   Socioeconomic History   Marital status: Single    Spouse name: Not on file   Number of children: Not on file   Years of education: Not on file   Highest education level: Not on file  Occupational History   Not on file  Tobacco Use   Smoking status: Former    Types: E-cigarettes   Smokeless tobacco: Never  Substance and Sexual Activity   Alcohol use: No   Drug use: No   Sexual activity: Yes    Birth control/protection: Condom  Other Topics Concern   Not on file  Social History Narrative   Not on file   Social Determinants of Health   Financial Resource Strain: Not on file  Food Insecurity: Not on file  Transportation Needs: Not on file  Physical Activity: Not on file  Stress: Not on file  Social Connections: Not on file    There were no vitals filed for this visit. There is no height or weight on file to calculate BMI.  Physical Exam  ASSESSMENT AND PLAN: There are no diagnoses linked to this encounter.  No follow-ups on file.  Quitman Norberto G. Martinique, MD  [facility]. [place] office.  Discharge Instructions   None

## 2022-05-05 ENCOUNTER — Encounter: Payer: Self-pay | Admitting: Family Medicine

## 2022-05-05 ENCOUNTER — Ambulatory Visit (INDEPENDENT_AMBULATORY_CARE_PROVIDER_SITE_OTHER): Payer: No Typology Code available for payment source | Admitting: Family Medicine

## 2022-05-05 VITALS — BP 110/70 | HR 79 | Temp 98.5°F | Resp 12 | Ht 66.75 in | Wt 196.2 lb

## 2022-05-05 DIAGNOSIS — B354 Tinea corporis: Secondary | ICD-10-CM

## 2022-05-05 MED ORDER — TOLNAFTATE 1 % EX CREA: 1.0000 | TOPICAL_CREAM | Freq: Two times a day (BID) | CUTANEOUS | 0 refills | Status: DC

## 2022-05-05 NOTE — Patient Instructions (Signed)
A few things to remember from today's visit:  Tinea corporis - Plan: tolnaftate (TINACTIN) 1 % cream Apply cream 2 times daily for 4 weeks. Selsun blue on area for 10 min then rinse may also help.  If you need refills for medications you take chronically, please call your pharmacy. Do not use My Chart to request refills or for acute issues that need immediate attention. If you send a my chart message, it may take a few days to be addressed, specially if I am not in the office.  Please be sure medication list is accurate. If a new problem present, please set up appointment sooner than planned today.

## 2022-05-20 ENCOUNTER — Ambulatory Visit: Payer: No Typology Code available for payment source | Admitting: Family Medicine

## 2022-05-20 ENCOUNTER — Encounter: Payer: Self-pay | Admitting: Family Medicine

## 2022-05-20 VITALS — BP 130/80 | HR 85 | Temp 97.9°F | Ht 66.75 in | Wt 195.4 lb

## 2022-05-20 DIAGNOSIS — F321 Major depressive disorder, single episode, moderate: Secondary | ICD-10-CM | POA: Diagnosis not present

## 2022-05-20 DIAGNOSIS — F411 Generalized anxiety disorder: Secondary | ICD-10-CM

## 2022-05-20 MED ORDER — SERTRALINE HCL 25 MG PO TABS: 25.0000 mg | ORAL_TABLET | Freq: Every day | ORAL | 1 refills | Status: DC

## 2022-05-20 NOTE — Progress Notes (Signed)
Established Patient Office Visit   Subjective  Patient ID: Linze Tonti, female    DOB: 12-20-2000  Age: 22 y.o. MRN: [id number]  Chief Complaint  Patient presents with   Medication Consultation    Patient is a 22 year old female followed by Dr. Caryl Never and seen for ongoing concern.  Patient endorses increased depression symptoms and decreased focus x 1 month.  Patient states in the past she was on Cymbalta 20 mg which did not seem to help with her energy.  Has been off med x 6 months.  In the past tried Lexapro which caused insomnia.  Patient was on one other medication, but does not recall the name.    Patient Active Problem List   Diagnosis Date Noted   Sleep disturbance 04/14/2020   Depression 03/17/2020   Migraine without aura and without status migrainosus, not intractable 02/14/2020   Generalized anxiety disorder 02/14/2020   Generalized abdominal pain    Helicobacter pylori antibody above reference range    Past Surgical History:  Procedure Laterality Date   TONSILLECTOMY AND ADENOIDECTOMY N/A 10/05/2019   Procedure: TONSILLECTOMY AND ADENOIDECTOMY;  Surgeon: Newman Pies, MD;  Location: [facility];  Service: ENT;  Laterality: N/A;   Social History   Tobacco Use   Smoking status: Former    Types: E-cigarettes   Smokeless tobacco: Never  Substance Use Topics   Alcohol use: No   Drug use: No   Family History  Problem Relation Age of Onset   Anxiety disorder Mother    COPD Maternal Grandmother    Cancer Maternal Grandmother    Stroke Maternal Grandfather    Depression Maternal Grandfather    Heart attack Maternal Grandfather    Allergies  Allergen Reactions   Clarithromycin       ROS Negative unless stated above    Objective:     BP 130/80 (BP Location: Left Arm, Patient Position: Sitting, Cuff Size: Normal)   Pulse 85   Temp 97.9 F (36.6 C) (Oral)   Ht 5' 6.75" (1.695 m)   Wt 195 lb 6.4 oz (88.6 kg)   LMP 05/03/2022  (Approximate)   SpO2 99%   BMI 30.83 kg/m  BP Readings from Last 3 Encounters:  05/20/22 130/80  05/05/22 110/70  04/21/22 102/70   Wt Readings from Last 3 Encounters:  05/20/22 195 lb 6.4 oz (88.6 kg)  05/05/22 196 lb 4 oz (89 kg)  04/21/22 191 lb 14.4 oz (87 kg)      Physical Exam Constitutional:      General: She is not in acute distress.    Appearance: Normal appearance.  HENT:     Head: Normocephalic and atraumatic.     Nose: Nose normal.     Mouth/Throat:     Mouth: Mucous membranes are moist.  Cardiovascular:     Rate and Rhythm: Normal rate.     Heart sounds: No murmur heard.    No gallop.  Pulmonary:     Effort: Pulmonary effort is normal. No respiratory distress.     Breath sounds: No wheezing, rhonchi or rales.  Skin:    General: Skin is warm and dry.  Neurological:     Mental Status: She is alert and oriented to person, place, and time.      No results found for any visits on 05/20/22.       05/20/2022    9:53 AM 05/05/2022    8:34 AM 04/21/2022    9:10 AM 11/28/2020  10:31 AM  GAD 7 : Generalized Anxiety Score  Nervous, Anxious, on Edge 1 0 0 0  Control/stop worrying 2 0 0 0  Worry too much - different things 2 0 0 0  Trouble relaxing 1 0 0 0  Restless 0 0 0 0  Easily annoyed or irritable 2 0 0 0  Afraid - awful might happen 0 0 0 0  Total GAD 7 Score 8 0 0 0  Anxiety Difficulty Somewhat difficult Not difficult at all         05/20/2022    9:53 AM 05/05/2022    8:34 AM 04/21/2022    9:10 AM  Depression screen PHQ 2/9  Decreased Interest 1 0 0  Down, Depressed, Hopeless 3 0 0  PHQ - 2 Score 4 0 0  Altered sleeping 2 0 0  Tired, decreased energy 2 0 0  Change in appetite 2 0 0  Feeling bad or failure about yourself  3 0 0  Trouble concentrating 2 0 0  Moving slowly or fidgety/restless 0 0 0  Suicidal thoughts 0 0 0  PHQ-9 Score 15 0 0  Difficult doing work/chores Somewhat difficult Not difficult at all     Assessment & Plan:   Depression, major, single episode, moderate -     Sertraline HCl; Take 1 tablet (25 mg total) by mouth daily.  Dispense: 30 tablet; Refill: 1  Generalized anxiety disorder -     Sertraline HCl; Take 1 tablet (25 mg total) by mouth daily.  Dispense: 30 tablet; Refill: 1  PHQ-9 score 15, GAD 7 score 8.  Discussed restarting medication.  Will start Zoloft.  Patient encouraged to set up counseling.  Close follow-up.  Return in about 6 weeks (around 07/01/2022).   Deeann Saint, MD

## 2022-05-20 NOTE — Patient Instructions (Signed)
Behavioral Health Services: To make an appointment contact the office/provider you are interested in seeing.  No referral is needed.  The below is not an all inclusive list, but will help you get started.  [email or web address] -counseling located off of [address]  [email or web address] -website helps you find providers in your area  [facility] -Located off of [address]. across from [address]  Dr. Jannifer Franklin is a Therapist, sports with [facility]. [phone]  [facility] Counseling and wellness  [facility]  -[address]  [phone] -a place in town that has counseling and Psychiatry services.

## 2022-06-03 ENCOUNTER — Telehealth: Payer: Self-pay | Admitting: Family Medicine

## 2022-06-03 NOTE — Telephone Encounter (Signed)
Hs questions on side effects of sertraline (ZOLOFT) 25 MG tablet. Last saw [facility] 05/20/22

## 2022-06-08 NOTE — Telephone Encounter (Signed)
ATC pt, no answer, left VM to call office back.

## 2022-06-14 ENCOUNTER — Ambulatory Visit: Payer: No Typology Code available for payment source | Admitting: Family Medicine

## 2022-06-14 ENCOUNTER — Encounter: Payer: Self-pay | Admitting: Family Medicine

## 2022-06-14 VITALS — BP 112/70 | HR 88 | Temp 99.2°F | Wt 197.8 lb

## 2022-06-14 DIAGNOSIS — F321 Major depressive disorder, single episode, moderate: Secondary | ICD-10-CM

## 2022-06-14 DIAGNOSIS — F411 Generalized anxiety disorder: Secondary | ICD-10-CM

## 2022-06-14 DIAGNOSIS — R635 Abnormal weight gain: Secondary | ICD-10-CM

## 2022-06-14 DIAGNOSIS — N926 Irregular menstruation, unspecified: Secondary | ICD-10-CM | POA: Diagnosis not present

## 2022-06-14 LAB — POCT URINE PREGNANCY: Preg Test, Ur: NEGATIVE

## 2022-06-14 MED ORDER — SERTRALINE HCL 25 MG PO TABS: 25.0000 mg | ORAL_TABLET | Freq: Every day | ORAL | 3 refills | Status: DC

## 2022-06-14 NOTE — Patient Instructions (Addendum)
I have included some information about hypothyroidism so you can see the possible effects it can cause.  If you are still having symptoms, consider having your TSH checked to make sure it is not due to your thyroid.  A refill on Sertraline was sent to your pharmacy.

## 2022-06-14 NOTE — Progress Notes (Signed)
Established Patient Office Visit   Subjective  Patient ID: Kimberly Nichols, female    DOB: 23-Sep-2000  Age: 22 y.o. MRN: [id number]  Chief Complaint  Patient presents with   Amenorrhea    Was 14 days late. Refoill on sertraline    Patient is a 22 year old female with history of anxiety and depression seen for follow-up.  Patient made appointment as menses was 14 days late.  Started this morning.  LMP 05/03/2022.  Patient endorses unprotected sex.  Typically has regular menses.  At home pregnancy test x 2 negative.  Pt denies increased stress, changes in meds other than starting zoloft.  Patient states sertraline 25 mg is working well.  Sleep, mood, appetite are good.  Pt has noticed a 20 pound weight gain, but it has been over the last 3 months.  Patient states eating fairly healthy.  Cut out sodas and sugary drinks.  May eat dinner late then go to bed.    Past Medical History:  Diagnosis Date   Abdominal pain, recurrent    Allergy    Anxiety    Depression    Hay fever    Helicobacter pylori antibody above reference range    Past Surgical History:  Procedure Laterality Date   TONSILLECTOMY AND ADENOIDECTOMY N/A 10/05/2019   Procedure: TONSILLECTOMY AND ADENOIDECTOMY;  Surgeon: Newman Pies, MD;  Location: [facility];  Service: ENT;  Laterality: N/A;   Social History   Tobacco Use   Smoking status: Former    Types: E-cigarettes   Smokeless tobacco: Never  Substance Use Topics   Alcohol use: No   Drug use: No   Family History  Problem Relation Age of Onset   Anxiety disorder Mother    COPD Maternal Grandmother    Cancer Maternal Grandmother    Stroke Maternal Grandfather    Depression Maternal Grandfather    Heart attack Maternal Grandfather    Allergies  Allergen Reactions   Clarithromycin       ROS Negative unless stated above    Objective:     BP 112/70 (BP Location: Left Arm, Patient Position: Sitting, Cuff Size: Normal)   Pulse 88    Temp 99.2 F (37.3 C) (Oral)   Wt 197 lb 12.8 oz (89.7 kg)   LMP 06/13/2022   SpO2 97%   BMI 31.21 kg/m  BP Readings from Last 3 Encounters:  06/14/22 112/70  05/20/22 130/80  05/05/22 110/70   Wt Readings from Last 3 Encounters:  06/14/22 197 lb 12.8 oz (89.7 kg)  05/20/22 195 lb 6.4 oz (88.6 kg)  05/05/22 196 lb 4 oz (89 kg)      Physical Exam Constitutional:      General: She is not in acute distress.    Appearance: Normal appearance.  HENT:     Head: Normocephalic and atraumatic.     Nose: Nose normal.     Mouth/Throat:     Mouth: Mucous membranes are moist.  Cardiovascular:     Rate and Rhythm: Normal rate and regular rhythm.     Heart sounds: Normal heart sounds. No murmur heard.    No gallop.  Pulmonary:     Effort: Pulmonary effort is normal. No respiratory distress.     Breath sounds: Normal breath sounds. No wheezing, rhonchi or rales.  Skin:    General: Skin is warm and dry.  Neurological:     Mental Status: She is alert and oriented to person, place, and time.  No results found for any visits on 06/14/22.    06/14/2022   11:13 AM 05/20/2022    9:53 AM 05/05/2022    8:34 AM  Depression screen PHQ 2/9  Decreased Interest 0 1 0  Down, Depressed, Hopeless 0 3 0  PHQ - 2 Score 0 4 0  Altered sleeping 0 2 0  Tired, decreased energy 0 2 0  Change in appetite 0 2 0  Feeling bad or failure about yourself  0 3 0  Trouble concentrating 0 2 0  Moving slowly or fidgety/restless 0 0 0  Suicidal thoughts 0 0 0  PHQ-9 Score 0 15 0  Difficult doing work/chores  Somewhat difficult Not difficult at all      06/14/2022   11:13 AM 05/20/2022    9:53 AM 05/05/2022    8:34 AM 04/21/2022    9:10 AM  GAD 7 : Generalized Anxiety Score  Nervous, Anxious, on Edge 0 1 0 0  Control/stop worrying 0 2 0 0  Worry too much - different things 0 2 0 0  Trouble relaxing 0 1 0 0  Restless 0 0 0 0  Easily annoyed or irritable 0 2 0 0  Afraid - awful might happen 0 0 0 0   Total GAD 7 Score 0 8 0 0  Anxiety Difficulty  Somewhat difficult Not difficult at all        Assessment & Plan:  Late menses -Discussed possible causes including increased stress, pregnancy, PCOS, thyroid dysfunction, endocrine disorders, medications, etc. -Urine hCG negative here in clinic and x 2 at home.. -Menses late x 14 days, however started this morning. -offered labs. Pt declines. -discussed contraception options. -continue f/u with OB/Gyn. -     POCT urine pregnancy  Generalized anxiety disorder -GAD-7 score 0 this visit.  Previously 8 on 05/20/2022. -Continue sertraline 25 mg daily -Counseling encouraged and self-care. -     Sertraline HCl; Take 1 tablet (25 mg total) by mouth daily.  Dispense: 30 tablet; Refill: 3  Depression, major, single episode, moderate (HCC) -PHQ-9 score 0 this visit previously 15 on 05/20/2022 -Continue sertraline 25 mg daily -Counseling encouraged on self-care. -In the past Cymbalta did not help managing and Lexapro caused insomnia. -     Sertraline HCl; Take 1 tablet (25 mg total) by mouth daily.  Dispense: 30 tablet; Refill: 3  Weight gain -Body mass index is 31.21 kg/m. -wt 197.8 LBS -Discussed lifestyle modifications including portions, increasing physical activity, avoiding eating so late. -offered labs such as TSH.  Pt declines at this time.  Return in about 3 months (around 09/14/2022).   Deeann Saint, MD

## 2022-08-18 ENCOUNTER — Ambulatory Visit: Payer: No Typology Code available for payment source | Admitting: Family Medicine

## 2022-08-18 VITALS — BP 110/76 | HR 85 | Temp 98.5°F | Ht 66.75 in | Wt 197.0 lb

## 2022-08-18 DIAGNOSIS — R300 Dysuria: Secondary | ICD-10-CM

## 2022-08-18 DIAGNOSIS — F339 Major depressive disorder, recurrent, unspecified: Secondary | ICD-10-CM | POA: Diagnosis not present

## 2022-08-18 LAB — POC URINALSYSI DIPSTICK (AUTOMATED)
Bilirubin, UA: NEGATIVE
Blood, UA: POSITIVE
Glucose, UA: NEGATIVE
Ketones, UA: NEGATIVE
Nitrite, UA: NEGATIVE
Protein, UA: POSITIVE — AB
Spec Grav, UA: 1.025 (ref 1.010–1.025)
Urobilinogen, UA: 0.2 E.U./dL
pH, UA: 5.5 (ref 5.0–8.0)

## 2022-08-18 MED ORDER — NITROFURANTOIN MONOHYD MACRO 100 MG PO CAPS: 100.0000 mg | ORAL_CAPSULE | Freq: Two times a day (BID) | ORAL | 0 refills | Status: DC

## 2022-08-18 MED ORDER — SERTRALINE HCL 50 MG PO TABS: 50.0000 mg | ORAL_TABLET | Freq: Every day | ORAL | 5 refills | Status: AC

## 2022-08-18 NOTE — Progress Notes (Signed)
Established Patient Office Visit  Subjective   Patient ID: Kimberly Nichols, female    DOB: Mar 14, 2000  Age: 22 y.o. MRN: [id number]  Chief Complaint  Patient presents with   Urinary Tract Infection    HPI   Kimberly Nichols is seen today with few day history of urine frequency and some mild burning with urination.  No fever.  No nausea or vomiting.  No flank pain.  She is tried some Azo and cranberry juice with some improvement.  Has had UTI in the past though not recently.  Second issue is history of depression.  She was evaluated for this recently in my absence and started on sertraline 25 mg once daily and she thinks this has helped somewhat but she would like to consider further titration.  No suicidal ideation.  Still has low motivation.  Has some anxiety symptoms associated with this but overall stable  Past Medical History:  Diagnosis Date   Abdominal pain, recurrent    Allergy    Anxiety    Depression    Hay fever    Helicobacter pylori antibody above reference range    Past Surgical History:  Procedure Laterality Date   TONSILLECTOMY AND ADENOIDECTOMY N/A 10/05/2019   Procedure: TONSILLECTOMY AND ADENOIDECTOMY;  Surgeon: Newman Pies, MD;  Location:  [facility];  Service: ENT;  Laterality: N/A;    reports that she has quit smoking. Her smoking use included e-cigarettes. She has never used smokeless tobacco. She reports that she does not drink alcohol and does not use drugs. family history includes Anxiety disorder in her mother; COPD in her maternal grandmother; Cancer in her maternal grandmother; Depression in her maternal grandfather; Heart attack in her maternal grandfather; Stroke in her maternal grandfather. Allergies  Allergen Reactions   Clarithromycin     Review of Systems  Constitutional:  Negative for chills and fever.  Genitourinary:  Positive for dysuria.  Musculoskeletal:  Negative for back pain.  Psychiatric/Behavioral:  Positive for depression.  Negative for suicidal ideas.       Objective:     BP 110/76 (BP Location: Left Arm, Patient Position: Sitting, Cuff Size: Normal)   Pulse 85   Temp 98.5 F (36.9 C) (Oral)   Ht 5' 6.75" (1.695 m)   Wt 197 lb (89.4 kg)   SpO2 99%   BMI 31.09 kg/m    Physical Exam Vitals reviewed.  Constitutional:      General: She is not in acute distress.    Appearance: Normal appearance. She is not toxic-appearing.  Cardiovascular:     Rate and Rhythm: Normal rate and regular rhythm.  Pulmonary:     Effort: Pulmonary effort is normal.     Breath sounds: Normal breath sounds. No wheezing or rales.  Neurological:     Mental Status: She is alert.  Psychiatric:        Mood and Affect: Mood normal.        Thought Content: Thought content normal.      Results for orders placed or performed in visit on 08/18/22  POCT Urinalysis Dipstick (Automated)  Result Value Ref Range   Color, UA Yellow    Clarity, UA Cloudy    Glucose, UA Negative Negative   Bilirubin, UA Negative    Ketones, UA Negative    Spec Grav, UA 1.025 1.010 - 1.025   Blood, UA Positive    pH, UA 5.5 5.0 - 8.0   Protein, UA Positive (A) Negative   Urobilinogen, UA 0.2  0.2 or 1.0 E.U./dL   Nitrite, UA Negative    Leukocytes, UA Large (3+) (A) Negative      The ASCVD Risk score (Arnett DK, et al., 2019) failed to calculate for the following reasons:   The 2019 ASCVD risk score is only valid for ages 64 to 66    Assessment & Plan:   #1 dysuria.  Suspect uncomplicated cystitis.  She has 3+ leukocytes and positive blood but negative nitrites.  Patient nontoxic in appearance.  Start Macrobid 1 twice daily for 5 days.  Stay well-hydrated.  Follow-up for any persistent or worsening symptoms  #2 history of recurrent depression.  Recently initiated on sertraline 25 mg daily.  Bumped up to 50 mg daily.  Follow-up for any persistent symptoms after being on this dose for 2 to 3 weeks.  Scheduled follow-up in 3 months to  reassess and sooner as needed Return in about 3 months (around 11/18/2022).    Kimberly Peat, MD

## 2022-11-17 ENCOUNTER — Encounter: Payer: Self-pay | Admitting: Neurology

## 2022-11-18 ENCOUNTER — Other Ambulatory Visit: Payer: Self-pay

## 2022-11-18 DIAGNOSIS — R202 Paresthesia of skin: Secondary | ICD-10-CM

## 2022-11-18 NOTE — Addendum Note (Signed)
Addended by: Karl Luke A on: 11/18/2022 07:54 AM   Modules accepted: Orders

## 2022-11-25 ENCOUNTER — Ambulatory Visit (INDEPENDENT_AMBULATORY_CARE_PROVIDER_SITE_OTHER): Payer: No Typology Code available for payment source | Admitting: Neurology

## 2022-11-25 DIAGNOSIS — R202 Paresthesia of skin: Secondary | ICD-10-CM

## 2022-11-25 NOTE — Procedures (Signed)
  [facility]  [address] [place], [address]  [place], [place] [address] Tel: [phone] Fax: [phone] Test Date:  11/25/2022  Patient: Kimberly Nichols DOB: 2000/09/26 Physician: Nita Sickle, DO  Sex: Female Height: 5\' 7"  Ref Phys: Margart Sickles, PA-C  ID#: [id number]   Technician:    History: This is a 22 year old female referred for evaluation of right hand paresthesias.  NCV & EMG Findings: Extensive electrodiagnostic testing of the right upper extremity shows:  Right median, ulnar, and mixed palmar sensory responses are within normal limits. Right median and ulnar motor responses are within normal limits. There is no evidence of active or chronic motor axonal loss changes affecting any of the tested muscles.  Motor unit configuration and recruitment pattern is within normal limits.  Impression: This is a normal study of the right upper extremity.  In particular, there is no evidence of carpal tunnel syndrome, ulnar neuropathy, or cervical radiculopathy.   ___________________________ Nita Sickle, DO    Nerve Conduction Studies   Stim Site NR Peak (ms) Norm Peak (ms) O-P Amp (V) Norm O-P Amp  Right Median Anti Sensory (2nd Digit)  32 C  Wrist    2.9 <3.3 47.6 >20  Right Ulnar Anti Sensory (5th Digit)  32 C  Wrist    2.8 <3.0 37.1 >18     Stim Site NR Onset (ms) Norm Onset (ms) O-P Amp (mV) Norm O-P Amp Site1 Site2 Delta-0 (ms) Dist (cm) Vel (m/s) Norm Vel (m/s)  Right Median Motor (Abd Poll Brev)  32 C  Wrist    3.0 <3.9 9.1 >6 Elbow Wrist 5.3 30.0 57 >51  Elbow    8.3  8.5         Right Ulnar Motor (Abd Dig Minimi)  32 C  Wrist    2.5 <3.0 10.1 >8 B Elbow Wrist 4.0 23.0 58 >51  B Elbow    6.5  9.4  A Elbow B Elbow 1.5 10.0 67 >51  A Elbow    8.0  9.1            Stim Site NR Peak (ms) Norm Peak (ms) P-T Amp (V) Site1 Site2 Delta-P (ms) Norm Delta (ms)  Right Median/Ulnar Palm Comparison (Wrist - 8cm)  32 C  Median Palm    1.4 <2.2 129.9  Median Palm Ulnar Palm 0.0   Ulnar Palm    1.4 <2.2 16.5       Electromyography   Side Muscle Ins.Act Fibs Fasc Recrt Amp Dur Poly Activation Comment  Right 1stDorInt Nml Nml Nml Nml Nml Nml Nml Nml N/A  Right PronatorTeres Nml Nml Nml Nml Nml Nml Nml Nml N/A  Right Biceps Nml Nml Nml Nml Nml Nml Nml Nml N/A  Right Triceps Nml Nml Nml Nml Nml Nml Nml Nml N/A  Right Deltoid Nml Nml Nml Nml Nml Nml Nml Nml N/A      Waveforms:

## 2022-12-29 ENCOUNTER — Telehealth: Payer: Self-pay | Admitting: Family Medicine

## 2022-12-29 NOTE — Telephone Encounter (Signed)
Pt's mother would like to know if pt's immunization record could be uploaded in MyChart. She is requesting a call back to pt at: [phone].

## 2022-12-29 NOTE — Telephone Encounter (Signed)
Patient's mother was seen in office today and I informed her that patient should have access to immunization records via her Mychart account

## 2023-01-10 ENCOUNTER — Encounter: Payer: Self-pay | Admitting: Nurse Practitioner

## 2023-01-10 ENCOUNTER — Ambulatory Visit (INDEPENDENT_AMBULATORY_CARE_PROVIDER_SITE_OTHER): Payer: No Typology Code available for payment source | Admitting: Nurse Practitioner

## 2023-01-10 VITALS — BP 102/72 | HR 115

## 2023-01-10 DIAGNOSIS — N760 Acute vaginitis: Secondary | ICD-10-CM | POA: Diagnosis not present

## 2023-01-10 DIAGNOSIS — N898 Other specified noninflammatory disorders of vagina: Secondary | ICD-10-CM

## 2023-01-10 DIAGNOSIS — N926 Irregular menstruation, unspecified: Secondary | ICD-10-CM

## 2023-01-10 DIAGNOSIS — B9689 Other specified bacterial agents as the cause of diseases classified elsewhere: Secondary | ICD-10-CM

## 2023-01-10 LAB — WET PREP FOR TRICH, YEAST, CLUE

## 2023-01-10 LAB — PREGNANCY, URINE: Preg Test, Ur: NEGATIVE

## 2023-01-10 MED ORDER — METRONIDAZOLE 500 MG PO TABS
500.0000 mg | ORAL_TABLET | Freq: Two times a day (BID) | ORAL | 0 refills | Status: DC
Start: 2023-01-10 — End: 2023-01-19

## 2023-01-10 NOTE — Progress Notes (Signed)
   Acute Office Visit  Subjective:    Patient ID: Kimberly Nichols, female    DOB: 2000/12/29, 22 y.o.   MRN: [id number]   HPI 22 y.o. G0 presents today for vaginal spotting. Typically has monthly cycles that are moderately heavy but this past month she has just been spotting since 11/24. Very light, mostly brown. Denies vaginal symptoms. Did have some nausea prior to spotting. PMP 10/26-10/29. Sexually active. Declines STD screening.   Patient's last menstrual period was 01/02/2023 (exact date).    Review of Systems  Constitutional: Negative.   Genitourinary:  Positive for menstrual problem. Negative for vaginal discharge and vaginal pain.       Objective:    Physical Exam Constitutional:      Appearance: Normal appearance.  Genitourinary:    General: Normal vulva.     Vagina: No vaginal discharge or erythema.     Cervix: Normal.     Uterus: Normal.      Comments: Odor present    BP 102/72   Pulse (!) 115   LMP 01/02/2023 (Exact Date)   SpO2 99%  Wt Readings from Last 3 Encounters:  08/18/22 197 lb (89.4 kg)  06/14/22 197 lb 12.8 oz (89.7 kg)  05/20/22 195 lb 6.4 oz (88.6 kg)        Patient informed chaperone available to be present for breast and/or pelvic exam. Patient has requested no chaperone to be present. Patient has been advised what will be completed during breast and pelvic exam.   Wet prep + clue cells (+ odor) UPT negative  Assessment & Plan:   Problem List Items Addressed This Visit   None Visit Diagnoses     Bacterial vaginosis    -  Primary   Relevant Medications   metroNIDAZOLE (FLAGYL) 500 MG tablet   Irregular bleeding       Relevant Orders   Pregnancy, urine   Vaginal odor       Relevant Orders   WET PREP FOR TRICH, YEAST, CLUE      Plan: UPT negative. Wet prep positive for clue cells - Flagyl 500 mg BID x 7 days. Will reach out if spotting persists after treatment.   Return if symptoms worsen or fail to  improve.    Olivia Mackie DNP, 1:50 PM 01/10/2023

## 2023-01-19 ENCOUNTER — Encounter: Payer: Self-pay | Admitting: Nurse Practitioner

## 2023-01-19 ENCOUNTER — Ambulatory Visit (INDEPENDENT_AMBULATORY_CARE_PROVIDER_SITE_OTHER): Payer: No Typology Code available for payment source | Admitting: Nurse Practitioner

## 2023-01-19 VITALS — BP 122/80 | HR 94

## 2023-01-19 DIAGNOSIS — R5383 Other fatigue: Secondary | ICD-10-CM

## 2023-01-19 DIAGNOSIS — N926 Irregular menstruation, unspecified: Secondary | ICD-10-CM | POA: Diagnosis not present

## 2023-01-19 NOTE — Telephone Encounter (Signed)
Call placed to patient. Patient is scheduled for OV today at 1330.   Routing to provider for final review. Patient is agreeable to disposition. Will close encounter.

## 2023-01-19 NOTE — Progress Notes (Signed)
   Acute Office Visit  Subjective:    Patient ID: Kimberly Nichols, female    DOB: 2000-07-19, 22 y.o.   MRN: [id number]   HPI 22 y.o. presents today for vaginal bleeding. Light bleeding started 12/9 and is more heavy now. Has used 1 tampon since this morning Seen 01/10/2023 for spotting since 01/02/23. Treated for BV at that time. Neg UPT at that time. Denies vaginal itching, discharge or odor. PMP 10/26-10/29. Cycles are typically normal. No family history of thyroid disease. Does complain of fatigue.   Patient's last menstrual period was 01/02/2023 (exact date).    Review of Systems  Constitutional:  Positive for fatigue.  Genitourinary:  Positive for menstrual problem and vaginal bleeding. Negative for vaginal discharge.       Objective:    Physical Exam Constitutional:      Appearance: Normal appearance.   GU: Not indicated  BP 122/80   Pulse 94   LMP 01/02/2023 (Exact Date)   SpO2 99%  Wt Readings from Last 3 Encounters:  08/18/22 197 lb (89.4 kg)  06/14/22 197 lb 12.8 oz (89.7 kg)  05/20/22 195 lb 6.4 oz (88.6 kg)        Assessment & Plan:   Problem List Items Addressed This Visit   None Visit Diagnoses     Irregular bleeding    -  Primary   Relevant Orders   TSH   Fatigue, unspecified type       Relevant Orders   TSH      Plan: TSH. If normal, ultrasound recommended.      Olivia Mackie DNP, 1:44 PM 01/19/2023

## 2023-01-20 ENCOUNTER — Encounter: Payer: Self-pay | Admitting: Nurse Practitioner

## 2023-01-20 ENCOUNTER — Other Ambulatory Visit: Payer: Self-pay | Admitting: Nurse Practitioner

## 2023-01-20 DIAGNOSIS — N939 Abnormal uterine and vaginal bleeding, unspecified: Secondary | ICD-10-CM

## 2023-01-20 LAB — TSH: TSH: 1.54 m[IU]/L

## 2023-01-20 NOTE — Telephone Encounter (Signed)
Yes, OK to send to outside imaging facility.

## 2023-01-20 NOTE — Telephone Encounter (Signed)
Spoke w/ pt and she prefers [facility]. Order placed.   Pt advised if does not hear from imaging location. She is welcome to call for appt. She voiced understanding and appreciation for the call.

## 2023-01-20 NOTE — Telephone Encounter (Signed)
Order pend which location that the patient desires to go.  LVMT[facility].

## 2023-01-24 ENCOUNTER — Ambulatory Visit (INDEPENDENT_AMBULATORY_CARE_PROVIDER_SITE_OTHER): Payer: No Typology Code available for payment source

## 2023-01-24 ENCOUNTER — Telehealth: Payer: Self-pay

## 2023-01-24 DIAGNOSIS — Z23 Encounter for immunization: Secondary | ICD-10-CM | POA: Diagnosis not present

## 2023-01-24 DIAGNOSIS — Z111 Encounter for screening for respiratory tuberculosis: Secondary | ICD-10-CM

## 2023-01-24 DIAGNOSIS — Z1159 Encounter for screening for other viral diseases: Secondary | ICD-10-CM

## 2023-01-24 NOTE — Progress Notes (Signed)
PPD Placement note Kimberly Nichols, 22 y.o. 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 No:  Is patient taking any oral or IV steroid medication now or have they taken it in the last month? no Has the patient ever received the BCG vaccine?: no Has the patient been in recent contact with anyone known or suspected of having active TB disease?: no      Date of exposure (if applicable):       Name of person they were exposed to (if applicable):  Patient's Country of origin?: [place] States O: Alert and oriented in NAD. P:  PPD placed on 01/24/2023.  Patient advised to return for reading within 48-72 hours.

## 2023-01-24 NOTE — Telephone Encounter (Signed)
Pt came in today for PPD skin test.   Pt want to know if provider can order Hep B titer test to confirm immunity. Pt received a complete series of a hep B vaccine at an adolescent age.   Please advise.

## 2023-01-25 NOTE — Telephone Encounter (Signed)
Labs placed and appt scheduled  

## 2023-01-26 ENCOUNTER — Other Ambulatory Visit: Payer: No Typology Code available for payment source

## 2023-01-26 ENCOUNTER — Ambulatory Visit: Payer: No Typology Code available for payment source | Admitting: *Deleted

## 2023-01-26 DIAGNOSIS — Z1159 Encounter for screening for other viral diseases: Secondary | ICD-10-CM

## 2023-01-26 LAB — TB SKIN TEST
Induration: 0 mm
TB Skin Test: NEGATIVE

## 2023-01-26 NOTE — Telephone Encounter (Signed)
PUS scheduled on 01/28/2023-for final review.

## 2023-01-27 LAB — HEPATITIS B SURFACE ANTIBODY,QUALITATIVE: Hep B S Ab: NONREACTIVE

## 2023-01-28 ENCOUNTER — Ambulatory Visit (HOSPITAL_BASED_OUTPATIENT_CLINIC_OR_DEPARTMENT_OTHER)
Admission: RE | Admit: 2023-01-28 | Discharge: 2023-01-28 | Disposition: A | Discharge status: Home / Self Care | Payer: No Typology Code available for payment source | Source: Ambulatory Visit | Attending: Nurse Practitioner | Admitting: Nurse Practitioner

## 2023-01-28 ENCOUNTER — Ambulatory Visit (INDEPENDENT_AMBULATORY_CARE_PROVIDER_SITE_OTHER): Payer: No Typology Code available for payment source

## 2023-01-28 DIAGNOSIS — N939 Abnormal uterine and vaginal bleeding, unspecified: Secondary | ICD-10-CM | POA: Diagnosis present

## 2023-01-28 DIAGNOSIS — Z23 Encounter for immunization: Secondary | ICD-10-CM

## 2023-01-31 ENCOUNTER — Encounter: Payer: Self-pay | Admitting: Nurse Practitioner

## 2023-01-31 NOTE — Telephone Encounter (Signed)
Pt confirmed seeing results and agreeable with recommendations

## 2023-03-01 ENCOUNTER — Ambulatory Visit (INDEPENDENT_AMBULATORY_CARE_PROVIDER_SITE_OTHER): Payer: No Typology Code available for payment source | Admitting: *Deleted

## 2023-03-01 DIAGNOSIS — Z23 Encounter for immunization: Secondary | ICD-10-CM | POA: Diagnosis not present

## 2023-06-28 ENCOUNTER — Encounter (HOSPITAL_BASED_OUTPATIENT_CLINIC_OR_DEPARTMENT_OTHER): Payer: Self-pay

## 2023-06-28 ENCOUNTER — Emergency Department (HOSPITAL_BASED_OUTPATIENT_CLINIC_OR_DEPARTMENT_OTHER)
Admission: EM | Admit: 2023-06-28 | Discharge: 2023-06-28 | Disposition: A | Discharge status: Home / Self Care | Attending: Emergency Medicine | Admitting: Emergency Medicine

## 2023-06-28 ENCOUNTER — Other Ambulatory Visit: Payer: Self-pay

## 2023-06-28 DIAGNOSIS — A084 Viral intestinal infection, unspecified: Secondary | ICD-10-CM | POA: Insufficient documentation

## 2023-06-28 DIAGNOSIS — R1013 Epigastric pain: Secondary | ICD-10-CM | POA: Diagnosis present

## 2023-06-28 LAB — CBC
HCT: 39.8 % (ref 36.0–46.0)
Hemoglobin: 13.2 g/dL (ref 12.0–15.0)
MCH: 29.6 pg (ref 26.0–34.0)
MCHC: 33.2 g/dL (ref 30.0–36.0)
MCV: 89.2 fL (ref 80.0–100.0)
Platelets: 260 10*3/uL (ref 150–400)
RBC: 4.46 MIL/uL (ref 3.87–5.11)
RDW: 12 % (ref 11.5–15.5)
WBC: 5.8 10*3/uL (ref 4.0–10.5)
nRBC: 0 % (ref 0.0–0.2)

## 2023-06-28 LAB — COMPREHENSIVE METABOLIC PANEL WITH GFR
ALT: 21 U/L (ref 0–44)
AST: 19 U/L (ref 15–41)
Albumin: 4.3 g/dL (ref 3.5–5.0)
Alkaline Phosphatase: 74 U/L (ref 38–126)
Anion gap: 12 (ref 5–15)
BUN: 14 mg/dL (ref 6–20)
CO2: 24 mmol/L (ref 22–32)
Calcium: 9.9 mg/dL (ref 8.9–10.3)
Chloride: 103 mmol/L (ref 98–111)
Creatinine, Ser: 0.89 mg/dL (ref 0.44–1.00)
GFR, Estimated: >60 mL/min (ref >60)
Glucose, Bld: 95 mg/dL (ref 70–99)
Potassium: 3.7 mmol/L (ref 3.5–5.1)
Sodium: 139 mmol/L (ref 135–145)
Total Bilirubin: 1 mg/dL (ref 0.0–1.2)
Total Protein: 7.1 g/dL (ref 6.5–8.1)

## 2023-06-28 LAB — LIPASE, BLOOD: Lipase: 22 U/L (ref 11–51)

## 2023-06-28 LAB — URINALYSIS, ROUTINE W REFLEX MICROSCOPIC
Bilirubin Urine: NEGATIVE
Glucose, UA: NEGATIVE mg/dL
Hgb urine dipstick: NEGATIVE
Ketones, ur: 15 mg/dL — AB
Nitrite: NEGATIVE
Specific Gravity, Urine: 1.032 — ABNORMAL HIGH (ref 1.005–1.030)
pH: 6 (ref 5.0–8.0)

## 2023-06-28 LAB — PREGNANCY, URINE: Preg Test, Ur: NEGATIVE

## 2023-06-28 MED ORDER — ONDANSETRON 4 MG PO TBDP: 4.0000 mg | ORAL_TABLET | Freq: Three times a day (TID) | ORAL | 0 refills | Status: AC | PRN

## 2023-06-28 MED ORDER — SODIUM CHLORIDE 0.9 % IV BOLUS
1000.0000 mL | Freq: Once | INTRAVENOUS | Status: AC
  Administered 2023-06-28: 1000 mL via INTRAVENOUS

## 2023-06-28 MED ORDER — ONDANSETRON 4 MG PO TBDP
8.0000 mg | ORAL_TABLET | Freq: Once | ORAL | Status: AC
  Administered 2023-06-28: 8 mg via ORAL
  Filled 2023-06-28: qty 2

## 2023-06-28 MED ORDER — FAMOTIDINE 20 MG PO TABS
20.0000 mg | ORAL_TABLET | Freq: Once | ORAL | Status: AC
  Administered 2023-06-28: 20 mg via ORAL
  Filled 2023-06-28: qty 1

## 2023-06-28 NOTE — Discharge Instructions (Signed)
 You have a viral stomach infection. This caused you to get dehydrated. You received IV fluids which helped rehydrate you. Please make sure to drink lots of water and Gatorade over the next couple days. This will improve over the next several days. I encourage you to follow-up with your primary doctor within the next week. If you are unable to keep any liquids down in 24 hours please return to be seen.

## 2023-06-28 NOTE — ED Triage Notes (Signed)
 Patient here POV from Home.  Endorses Nausea since Saturday. Vomiting since then as well. Epigastric ABD Pain as well.   Some diarrhea Saturday, none since. No Known fevers. No Dysuria.   NAD noted during triage. A&Ox4. CGS 15. Ambulatory.

## 2023-06-28 NOTE — ED Provider Notes (Signed)
 [place] EMERGENCY DEPARTMENT AT [facility] Provider Note   CSN: [id number] Arrival date & time: 06/28/23  4098     History  Chief Complaint  Patient presents with   Nausea    Kimberly Nichols is a 23 y.o. female.  Kimberly Nichols is a 23 y.o. female presenting today for abdominal pain.  Pertinent PMHx includes, heavy menstrual bleeding, thoracic outlet syndrome.  Patient reports she has had 3 days of epigastric abdominal pain, nausea, nonbloody nonbilious vomiting.  Thinks that she has had 10 episodes of vomiting overall.  The nausea she states is up in her throat.  The pain is achy/full and in the epigastric region.  Does not radiate.  She has had some chills but no known fevers.  She has not had cough or congestion.  Her last menstrual cycle was 3 weeks ago.  She is not sexually active.  She has had no dysuria or frequency.  She had several episodes of diarrhea on Sunday, but has not had a bowel movement since.  Before that she was having regular bowel movements.  Overall feels like she has mildly improved.  The history is provided by the patient and a parent.       Home Medications Prior to Admission medications   Medication Sig Start Date End Date Taking? Authorizing Provider  ondansetron  (ZOFRAN -ODT) 4 MG disintegrating tablet Take 1 tablet (4 mg total) by mouth every 8 (eight) hours as needed for nausea or vomiting. 06/28/23  Yes Ivin Marrow, MD  sertraline  (ZOLOFT ) 50 MG tablet Take 1 tablet (50 mg total) by mouth daily. 08/18/22   Burchette, Marijean Shouts, MD      Allergies    Clarithromycin    Review of Systems   Review of Systems  Physical Exam Updated Vital Signs BP 114/80 (BP Location: Right Arm)   Pulse 68   Temp 98.1 F (36.7 C) (Oral)   Resp 18   Ht 5\' 8"  (1.727 m)   Wt 86.2 kg   LMP 05/29/2023   SpO2 100%   BMI 28.89 kg/m  Physical Exam Vitals and nursing note reviewed.  Constitutional:      General: She is not in acute  distress.    Appearance: Normal appearance. She is well-developed.  HENT:     Head: Normocephalic and atraumatic.     Nose: Nose normal. No congestion.     Mouth/Throat:     Mouth: Mucous membranes are moist.  Eyes:     Conjunctiva/sclera: Conjunctivae normal.  Cardiovascular:     Rate and Rhythm: Normal rate and regular rhythm.     Heart sounds: No murmur heard. Pulmonary:     Effort: Pulmonary effort is normal. No respiratory distress.     Breath sounds: Normal breath sounds.  Abdominal:     Palpations: Abdomen is soft.     Tenderness: There is no abdominal tenderness.  Musculoskeletal:        General: No swelling.     Cervical back: Neck supple.  Skin:    General: Skin is warm and dry.     Capillary Refill: Capillary refill takes less than 2 seconds.  Neurological:     Mental Status: She is alert.  Psychiatric:        Mood and Affect: Mood normal.     ED Results / Procedures / Treatments   Labs (all labs ordered are listed, but only abnormal results are displayed) Labs Reviewed  URINALYSIS, ROUTINE W REFLEX MICROSCOPIC - Abnormal; Notable  for the following components:      Result Value   APPearance HAZY (*)    Specific Gravity, Urine 1.032 (*)    Ketones, ur 15 (*)    Protein, ur TRACE (*)    Leukocytes,Ua TRACE (*)    Bacteria, UA RARE (*)    All other components within normal limits  LIPASE, BLOOD  COMPREHENSIVE METABOLIC PANEL WITH GFR  CBC  PREGNANCY, URINE    EKG None  Radiology No results found.  Procedures Procedures    Medications Ordered in ED Medications  ondansetron  (ZOFRAN -ODT) disintegrating tablet 8 mg (8 mg Oral Given 06/28/23 0930)  famotidine (PEPCID) tablet 20 mg (20 mg Oral Given 06/28/23 0930)  sodium chloride  0.9 % bolus 1,000 mL (1,000 mLs Intravenous New Bag/Given 06/28/23 1610)    ED Course/ Medical Decision Making/ A&P Clinical Course as of 06/28/23 1056  Tue Jun 28, 2023  0917 Pregnancy, urine Negative urine pregnancy  test. [MQ]  9604 UA suggestive of mild dehydration. 1L bolus NS ordered. [MQ]  1053 CMP, CBC normal. [MQ]  1054 Patient reevaluated and feeling greatly improved after fluids.  [MQ]    Clinical Course User Index [MQ] Ivin Marrow, MD                                 Medical Decision Making Suspect this is secondary to viral gastroenteritis versus GERD. VSS, benign abdominal exam. Low suspicion for appendicitis, cholecystitis, SBO, constipation or other emergent pathology. Will trial pepcid and zofran  for nausea. PO challenge as well. Will work-up with initial labs including urine pregnancy, CMP, CBC, UA.  Work-up reassuring, likely viral gastroenteritis. Counseled on dehydration precautions and recommended close PCP follow-up. Short term prescription of Zofran  sent to patient's pharmacy.  At this time there does not appear to be any evidence of an acute emergency medical condition and the patient appears stable for discharge with appropriate outpatient follow up. Diagnosis was discussed with patient who verbalizes understanding of care plan and is agreeable to discharge. I have discussed return precautions with patient and father who verbalizes understanding. Patient encouraged to follow-up with their PCP as soon as possible. All questions answered.    Amount and/or Complexity of Data Reviewed Labs: ordered. Decision-making details documented in ED Course. Radiology:  Decision-making details documented in ED Course. ECG/medicine tests:  Decision-making details documented in ED Course.  Risk Prescription drug management.          Final Clinical Impression(s) / ED Diagnoses Final diagnoses:  Viral gastroenteritis    Rx / DC Orders ED Discharge Orders          Ordered    ondansetron  (ZOFRAN -ODT) 4 MG disintegrating tablet  Every 8 hours PRN        06/28/23 1054              Ivin Marrow, MD 06/28/23 1056    Sueellen Emery, MD 06/28/23 [phone]

## 2023-06-30 ENCOUNTER — Ambulatory Visit: Payer: Self-pay

## 2023-06-30 ENCOUNTER — Ambulatory Visit (INDEPENDENT_AMBULATORY_CARE_PROVIDER_SITE_OTHER): Admitting: Family Medicine

## 2023-06-30 ENCOUNTER — Encounter: Payer: Self-pay | Admitting: Family Medicine

## 2023-06-30 VITALS — BP 98/66 | HR 56 | Temp 98.6°F | Wt 195.4 lb

## 2023-06-30 DIAGNOSIS — R197 Diarrhea, unspecified: Secondary | ICD-10-CM

## 2023-06-30 DIAGNOSIS — R110 Nausea: Secondary | ICD-10-CM

## 2023-06-30 DIAGNOSIS — A084 Viral intestinal infection, unspecified: Secondary | ICD-10-CM

## 2023-06-30 LAB — POCT URINALYSIS DIPSTICK
Bilirubin, UA: NEGATIVE
Blood, UA: POSITIVE
Glucose, UA: NEGATIVE
Ketones, UA: POSITIVE
Leukocytes, UA: NEGATIVE
Nitrite, UA: NEGATIVE
Protein, UA: POSITIVE — AB
Spec Grav, UA: >=1.03 — AB (ref 1.010–1.025)
Urobilinogen, UA: 0.2 U/dL
pH, UA: 6 (ref 5.0–8.0)

## 2023-06-30 NOTE — Telephone Encounter (Signed)
 Copied from CRM [id number]. Topic: Clinical - Red Word Triage >> Jun 30, 2023  8:02 AM Marlan Silva wrote: Red Word that prompted transfer to Nurse Triage: Patient called in stating that she was seen in the er recently and that she was given medication for a stomach bug. Patient states the medication is not working and she wanted to know if there was something else she can take. Patient does not want an appointment and would like to speak with a nurse.  Chief Complaint: abd pain, n/v/d, yellow stool Symptoms: see above Frequency: constant Pertinent Negatives: Patient denies fever, blood in urine/stool Disposition: [] ED /[] Urgent Care (no appt availability in office) / [x] Appointment(In office/virtual)/ []  [place] [facility]/ [] Home Care/ [] Refused Recommended Disposition /[] [facility]/ []  Follow-up with PCP Additional Notes: was seen in ER on Tuesday; apt made for today per protocol; care advice given, denies questions; instructed to go to ER if becomes worse.   Reason for Disposition  [1] MILD-MODERATE pain AND [2] constant AND [3] present > 2 hours  Answer Assessment - Initial Assessment Questions 1. LOCATION: "Where does it hurt?"      Abd pain in between ribs 2. RADIATION: "Does the pain shoot anywhere else?" (e.g., chest, back)     no 3. ONSET: "When did the pain begin?" (e.g., minutes, hours or days ago)      Was seen in ER on Tuesday, was dx with dehydration and given zofran  4. SUDDEN: "Gradual or sudden onset?"     Monday 5. PATTERN "Does the pain come and go, or is it constant?"    - If it comes and goes: "How long does it last?" "Do you have pain now?"     (Note: Comes and goes means the pain is intermittent. It goes away completely between bouts.)    - If constant: "Is it getting better, staying the same, or getting worse?"      (Note: Constant means the pain never goes away completely; most serious pain is constant and gets worse.)      constant 6. SEVERITY: "How  bad is the pain?"  (e.g., Scale 1-10; mild, moderate, or severe)    - MILD (1-3): Doesn't interfere with normal activities, abdomen soft and not tender to touch.     - MODERATE (4-7): Interferes with normal activities or awakens from sleep, abdomen tender to touch.     - SEVERE (8-10): Excruciating pain, doubled over, unable to do any normal activities.       Moderate to severe 7. RECURRENT SYMPTOM: "Have you ever had this type of stomach pain before?" If Yes, ask: "When was the last time?" and "What happened that time?"      denies 8. CAUSE: "What do you think is causing the stomach pain?"     no 9. RELIEVING/AGGRAVATING FACTORS: "What makes it better or worse?" (e.g., antacids, bending or twisting motion, bowel movement)     Zofran  and pepcid are not helping 10. OTHER SYMPTOMS: "Do you have any other symptoms?" (e.g., back pain, diarrhea, fever, urination pain, vomiting)       Diarrhea, nausea and vomiting, states yellow stool. 11. PREGNANCY: "Is there any chance you are pregnant?" "When was your last menstrual period?"       na  Protocols used: Abdominal Pain - [facility]

## 2023-06-30 NOTE — Patient Instructions (Signed)
 Your UA urine test still shows signs of dehydration.  There was some blood in your urine but this is due to to your period.  Try to increase your intake of fluids by drinking small sips of water, Gatorade or Powerade or other rehydration solutions throughout the day.  Your symptoms still appear to be related to a viral gastroenteritis.  You can try taking 2 (8 mg) of the 4 mg Zofran  tabs every 8 hours as needed for nausea/vomiting.  You can also use over-the-counter Imodium for diarrhea.  Avoid eating salads for right now.

## 2023-06-30 NOTE — Progress Notes (Signed)
 Established Patient Office Visit   Subjective  Patient ID: Kimberly Nichols, female    DOB: 18-Jan-2001  Age: 23 y.o. MRN: [id number]  Chief Complaint  Patient presents with   Medical Management of Chronic Issues    Abdominal pain, started Sat, Nausea, Vomiting, Diarrhea, seen in the ED 5/20 Viral gastroenteritis       Patient is a 23 year old female followed by Dr. Darren Em and seen for acute concern.  Patient seen in the ED on 06/28/2023 for nausea, vomiting, abdominal pain, diarrhea that started 3 days prior.  In ED CBC, CMP, UA negative.  Given fluid for dehydration.  Given Rx for Zofran .  Patient endorses continued including loose watery stools 4-5/day, epigastric pressure, night sweats, hot flashes.  Denies fever, sore throat, cough cough, rhinorrhea, sick contacts.  LMP started 06/28/2023.  Zofran  is not working.  Is able to eat a little last night and had a salad this morning for breakfast.    Patient Active Problem List   Diagnosis Date Noted   Sleep disturbance 04/14/2020   Depression 03/17/2020   Migraine without aura and without status migrainosus, not intractable 02/14/2020   Generalized anxiety disorder 02/14/2020   Generalized abdominal pain    Helicobacter pylori antibody above reference range    Past Medical History:  Diagnosis Date   Abdominal pain, recurrent    Allergy    Anxiety    Depression    Hay fever    Helicobacter pylori antibody above reference range    Past Surgical History:  Procedure Laterality Date   TONSILLECTOMY AND ADENOIDECTOMY N/A 10/05/2019   Procedure: TONSILLECTOMY AND ADENOIDECTOMY;  Surgeon: Reynold Caves, MD;  Location: [facility];  Service: ENT;  Laterality: N/A;   Social History   Tobacco Use   Smoking status: Former    Types: E-cigarettes   Smokeless tobacco: Never  Substance Use Topics   Alcohol use: No   Drug use: No   Family History  Problem Relation Age of Onset   Anxiety disorder Mother    COPD  Maternal Grandmother    Cancer Maternal Grandmother    Stroke Maternal Grandfather    Depression Maternal Grandfather    Heart attack Maternal Grandfather    Allergies  Allergen Reactions   Clarithromycin     ROS Negative unless stated above    Objective:      BP 98/66 (BP Location: Left Arm, Patient Position: Sitting, Cuff Size: Normal)   Pulse (!) 56   Temp 98.6 F (37 C) (Oral)   Wt 195 lb 6.4 oz (88.6 kg)   LMP 05/29/2023   SpO2 95%   BMI 29.71 kg/m  BP Readings from Last 3 Encounters:  06/30/23 98/66  06/28/23 114/80  01/19/23 122/80   Wt Readings from Last 3 Encounters:  06/30/23 195 lb 6.4 oz (88.6 kg)  06/28/23 190 lb (86.2 kg)  08/18/22 197 lb (89.4 kg)      Physical Exam Constitutional:      General: She is not in acute distress.    Appearance: Normal appearance.  HENT:     Head: Normocephalic and atraumatic.     Nose: Nose normal.     Mouth/Throat:     Mouth: Mucous membranes are moist.  Cardiovascular:     Rate and Rhythm: Normal rate and regular rhythm.     Heart sounds: Normal heart sounds. No murmur heard.    No gallop.  Pulmonary:     Effort: Pulmonary effort is normal. No  respiratory distress.     Breath sounds: Normal breath sounds. No wheezing, rhonchi or rales.  Abdominal:     General: Bowel sounds are normal.     Palpations: Abdomen is soft.     Tenderness: There is generalized abdominal tenderness.     Comments: Epigastric soreness.  Skin:    General: Skin is warm and dry.  Neurological:     Mental Status: She is alert and oriented to person, place, and time.        06/14/2022   11:13 AM 05/20/2022    9:53 AM 05/05/2022    8:34 AM  Depression screen PHQ 2/9  Decreased Interest 0 1 0  Down, Depressed, Hopeless 0 3 0  PHQ - 2 Score 0 4 0  Altered sleeping 0 2 0  Tired, decreased energy 0 2 0  Change in appetite 0 2 0  Feeling bad or failure about yourself  0 3 0  Trouble concentrating 0 2 0  Moving slowly or  fidgety/restless 0 0 0  Suicidal thoughts 0 0 0  PHQ-9 Score 0 15 0  Difficult doing work/chores  Somewhat difficult Not difficult at all      06/14/2022   11:13 AM 05/20/2022    9:53 AM 05/05/2022    8:34 AM 04/21/2022    9:10 AM  GAD 7 : Generalized Anxiety Score  Nervous, Anxious, on Edge 0 1 0 0  Control/stop worrying 0 2 0 0  Worry too much - different things 0 2 0 0  Trouble relaxing 0 1 0 0  Restless 0 0 0 0  Easily annoyed or irritable 0 2 0 0  Afraid - awful might happen 0 0 0 0  Total GAD 7 Score 0 8 0 0  Anxiety Difficulty  Somewhat difficult Not difficult at all      Results for orders placed or performed in visit on 06/30/23  POCT urinalysis dipstick  Result Value Ref Range   Color, UA amber    Clarity, UA clear    Glucose, UA Negative Negative   Bilirubin, UA neg    Ketones, UA pos    Spec Grav, UA >=1.030 (A) 1.010 - 1.025   Blood, UA pos    pH, UA 6.0 5.0 - 8.0   Protein, UA Positive (A) Negative   Urobilinogen, UA 0.2 0.2 or 1.0 E.U./dL   Nitrite, UA neg    Leukocytes, UA Negative Negative   Appearance     Odor        Assessment & Plan:   Viral gastroenteritis  Nausea -     POCT urinalysis dipstick  Diarrhea, unspecified type  Patient with continued acute gastroenteritis symptoms.  Labs from ED reviewed and negative including urine hCG, CBC, CMP.  UA with signs of dehydration.  Repeat UA in clinic similar.  Blood noted on UA likely 2/2 menses.  Patient to increase p.o. fluid intake.  Discussed taking small sips throughout the day of rehydration solutions, water, Gatorade, etc. for continued nausea okay to increase Zofran  to 8 mg.  Can take OTC Imodium if needed for loose stools.  Bland diet, advance as tolerated.  Given strict precautions.  Offered COVID testing as may cause GI symptoms prior to start of URI symptoms.  Patient declines at this time.  No follow-ups on file.   Viola Greulich, MD

## 2023-08-01 ENCOUNTER — Ambulatory Visit (INDEPENDENT_AMBULATORY_CARE_PROVIDER_SITE_OTHER): Admitting: Family Medicine

## 2023-08-01 VITALS — BP 126/80 | HR 74 | Temp 98.6°F | Wt 193.4 lb

## 2023-08-01 DIAGNOSIS — R1011 Right upper quadrant pain: Secondary | ICD-10-CM

## 2023-08-01 NOTE — Progress Notes (Signed)
 Established Patient Office Visit  Subjective   Patient ID: Kimberly Nichols, female    DOB: 2000-10-26  Age: 23 y.o. MRN: [id number]  Chief Complaint  Patient presents with   Abdominal Pain    Patient complains of abdominal pain, x1 week    Nausea    Patient complains of nausea, x1 week     HPI   Kimberly Nichols is seen today with some epigastric abdominal pain and some nausea without vomiting for the past week or so.  She describes pain as sharp.  Not aware of any significant radiation.  Perhaps some mild right upper quadrant pain.  Denies any active GERD symptoms.  Food does not exacerbate or improve symptoms.  Rare alcohol use.  No nonsteroidal use.  Denies any recent melena or hematemesis.  Appetite and weight stable.  No recent major dietary changes.  She had recent ER visit on 20 May for viral gastroenteritis .  Had labs that time including lipase, CMP, CBC all basically unremarkable.  No history of known peptic ulcer disease.  Past Medical History:  Diagnosis Date   Abdominal pain, recurrent    Allergy    Anxiety    Depression    Hay fever    Helicobacter pylori antibody above reference range    Past Surgical History:  Procedure Laterality Date   TONSILLECTOMY AND ADENOIDECTOMY N/A 10/05/2019   Procedure: TONSILLECTOMY AND ADENOIDECTOMY;  Surgeon: Karis Clunes, MD;  Location: [facility];  Service: ENT;  Laterality: N/A;    reports that she has quit smoking. Her smoking use included e-cigarettes. She has never used smokeless tobacco. She reports that she does not drink alcohol and does not use drugs. family history includes Anxiety disorder in her mother; COPD in her maternal grandmother; Cancer in her maternal grandmother; Depression in her maternal grandfather; Heart attack in her maternal grandfather; Stroke in her maternal grandfather. Allergies  Allergen Reactions   Clarithromycin     Review of Systems  Constitutional:  Negative for chills, fever and  malaise/fatigue.  Eyes:  Negative for blurred vision.  Respiratory:  Negative for shortness of breath.   Cardiovascular:  Negative for chest pain.  Gastrointestinal:  Positive for abdominal pain and nausea. Negative for blood in stool, heartburn, melena and vomiting.  Genitourinary:  Negative for dysuria.  Neurological:  Negative for dizziness, weakness and headaches.      Objective:     BP 126/80 (BP Location: Left Arm, Patient Position: Sitting, Cuff Size: Normal)   Pulse 74   Temp 98.6 F (37 C) (Oral)   Wt 193 lb 6.4 oz (87.7 kg)   SpO2 98%   BMI 29.41 kg/m  BP Readings from Last 3 Encounters:  08/01/23 126/80  06/30/23 98/66  06/28/23 114/80   Wt Readings from Last 3 Encounters:  08/01/23 193 lb 6.4 oz (87.7 kg)  06/30/23 195 lb 6.4 oz (88.6 kg)  06/28/23 190 lb (86.2 kg)      Physical Exam Constitutional:      General: She is not in acute distress.    Appearance: She is well-developed. She is not ill-appearing.  Neck:     Thyroid: No thyromegaly.     Vascular: No JVD.   Cardiovascular:     Rate and Rhythm: Normal rate and regular rhythm.     Heart sounds:     No gallop.  Pulmonary:     Effort: Pulmonary effort is normal. No respiratory distress.     Breath sounds: Normal breath  sounds. No wheezing or rales.  Abdominal:     General: Abdomen is flat. Bowel sounds are normal.     Palpations: Abdomen is soft.     Tenderness: There is abdominal tenderness.     Comments: She has some mild right upper quadrant tenderness and to a lesser extent epigastric.  No hepatomegaly.  No guarding or rebound.   Musculoskeletal:     Cervical back: Neck supple.   Neurological:     Mental Status: She is alert.      No results found for any visits on 08/01/23.  Last CBC Lab Results  Component Value Date   WBC 5.8 06/28/2023   HGB 13.2 06/28/2023   HCT 39.8 06/28/2023   MCV 89.2 06/28/2023   MCH 29.6 06/28/2023   RDW 12.0 06/28/2023   PLT 260 06/28/2023    Last metabolic panel Lab Results  Component Value Date   GLUCOSE 95 06/28/2023   NA 139 06/28/2023   K 3.7 06/28/2023   CL 103 06/28/2023   CO2 24 06/28/2023   BUN 14 06/28/2023   CREATININE 0.89 06/28/2023   GFRNONAA >60 06/28/2023   CALCIUM 9.9 06/28/2023   PROT 7.1 06/28/2023   ALBUMIN 4.3 06/28/2023   BILITOT 1.0 06/28/2023   ALKPHOS 74 06/28/2023   AST 19 06/28/2023   ALT 21 06/28/2023   ANIONGAP 12 06/28/2023      The ASCVD Risk score (Arnett DK, et al., 2019) failed to calculate for the following reasons:   The 2019 ASCVD risk score is only valid for ages 80 to 21    Assessment & Plan:   Problem List Items Addressed This Visit   None Visit Diagnoses       Abdominal pain, RUQ    -  Primary   Relevant Orders   US  Abdomen Complete     1 week history of epigastric and to a lesser extent right upper quadrant abdominal pain.  Recent lab work unremarkable.  No regular nonsteroidal use.  No regular alcohol use.  Does not have any red flags such as appetite change, fever, vomiting, melena, etc.  -Recommend consider trial of over-the-counter H2 blocker such as Pepcid  20 mg twice daily - Set up abdominal ultrasound given the fact she does have some tenderness right upper quadrant - Follow-up immediately for any fever, progressive abdominal pain, or other concerns  No follow-ups on file.    Wolm Scarlet, MD

## 2023-08-01 NOTE — Patient Instructions (Signed)
 Consider trial of over the counter Pepcid  20 mg twice daily as needed.  I will be setting up abdominal ultrasound

## 2023-09-05 IMAGING — CT CT HEAD W/O CM
4 series · 16 of 47 positions shown, 18 images · non-contrast
Comparison: None.

CLINICAL DATA: Headache, chronic, new features or increased
frequency



[Series 2: head bone · axial · 0.41mm/px · z∈[+1007,+1035]mm · 3 of 72 slices shown]
[im 8/72  bone]
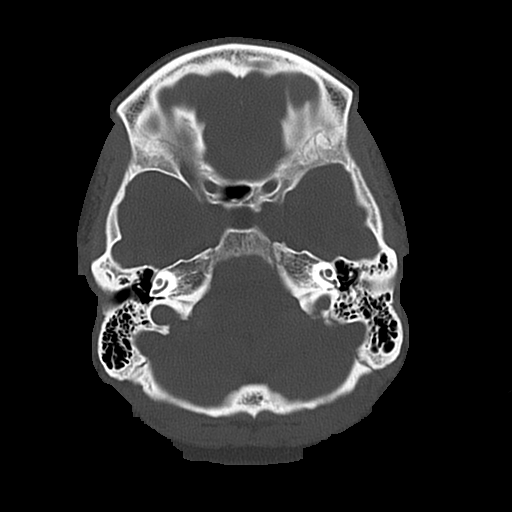
[im 15/72  bone]
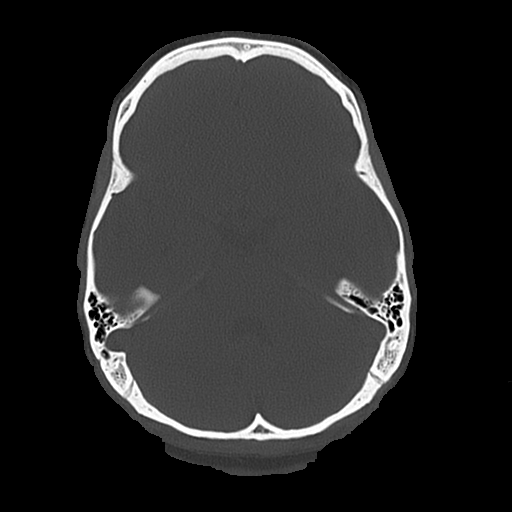
[im 22/72  bone]
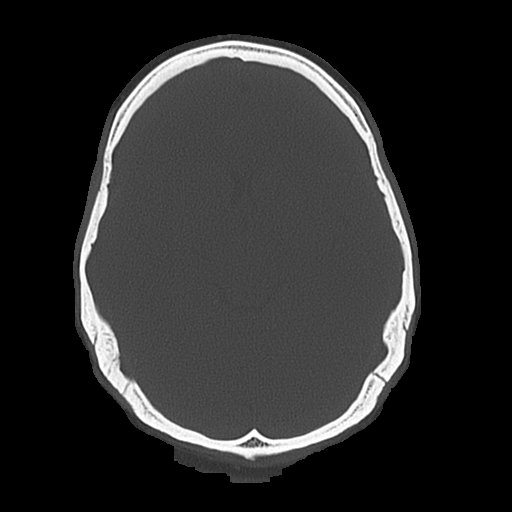

[Series 3: head wo · axial · 0.41mm/px · z∈[+1008,+1113]mm · 7 of 29 slices shown, 9 images]
[im 4/29  brain]
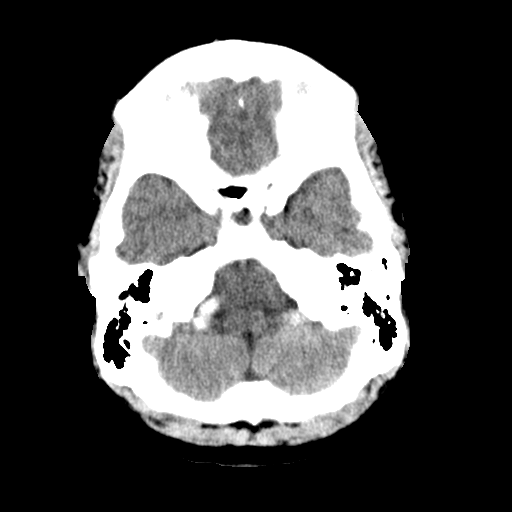
[im 4/29  bone]
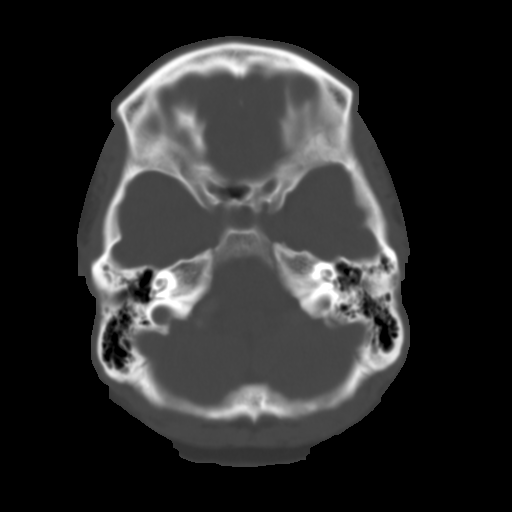
[im 8/29  brain]
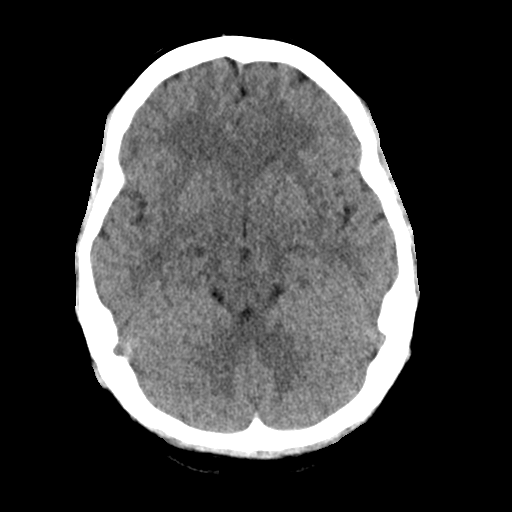
[im 11/29  brain]
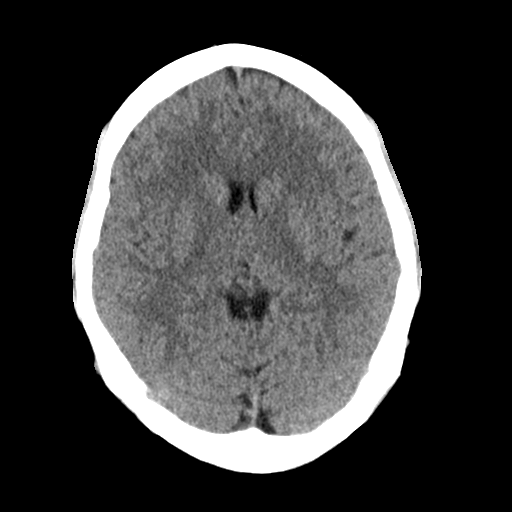
[im 15/29  brain]
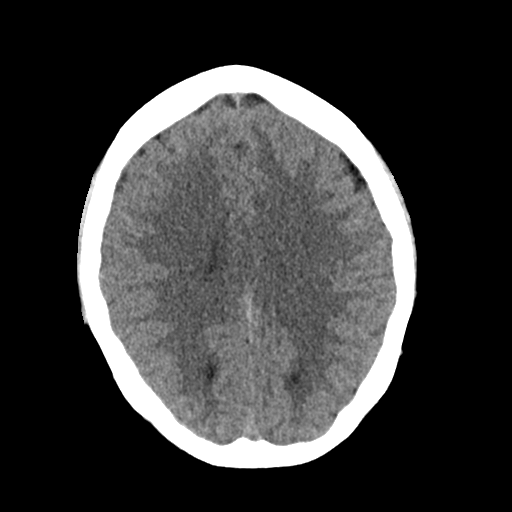
[im 18/29  brain]
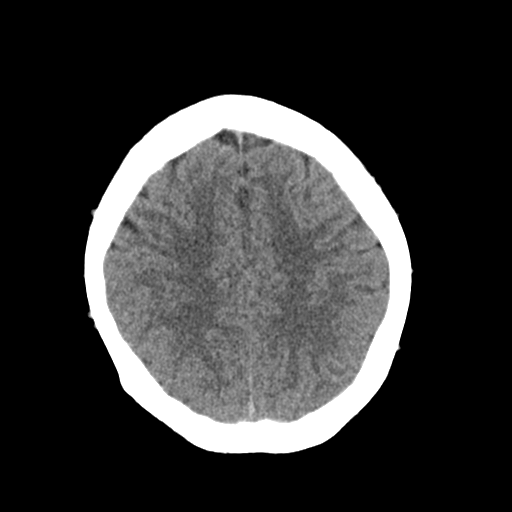
[im 18/29  bone]
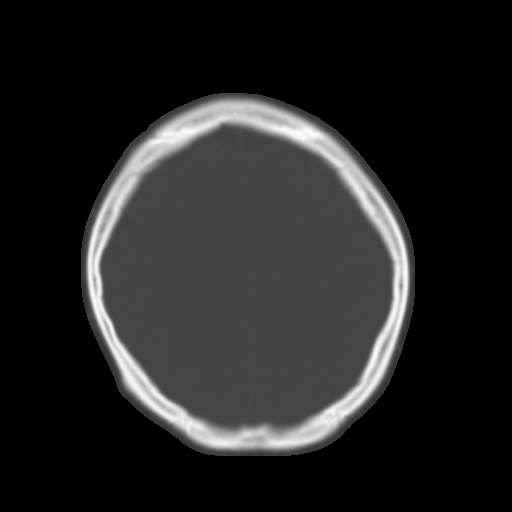
[im 22/29  brain]
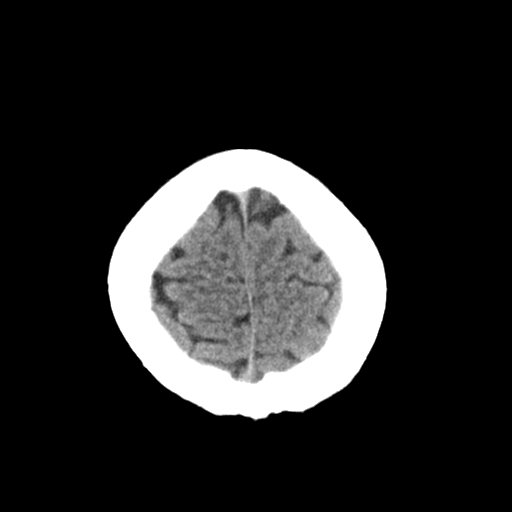
[im 25/29  brain]
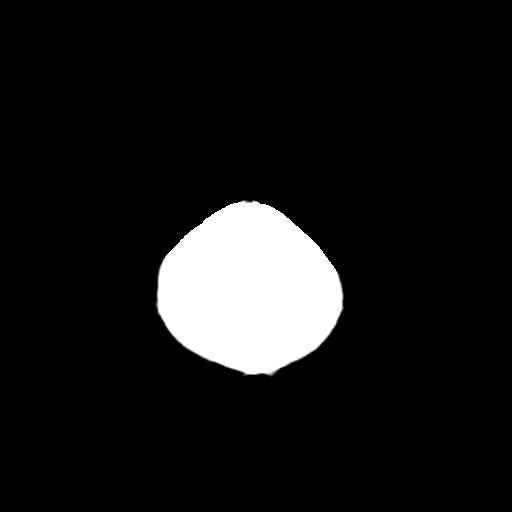

[Series 4: coronal soft · coronal · 0.27mm/px · 3 of 62 slices shown]
[im 21/62  brain]
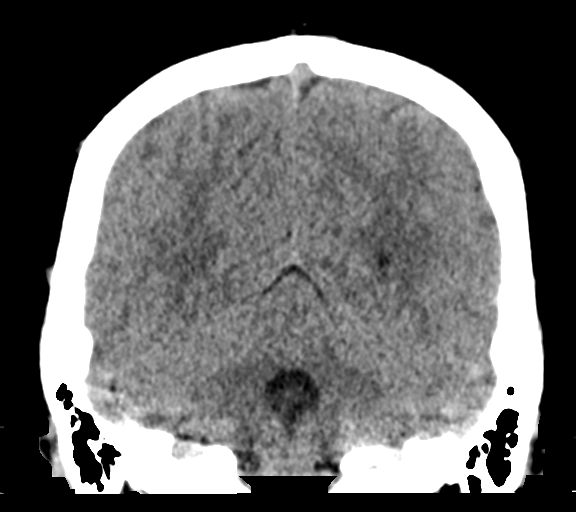
[im 28/62  brain]
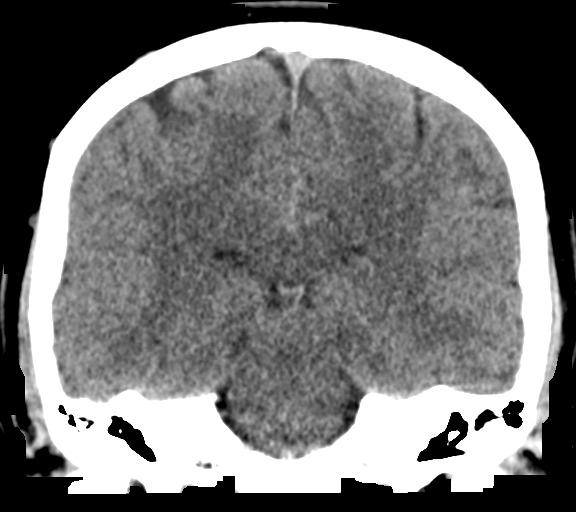
[im 34/62  brain]
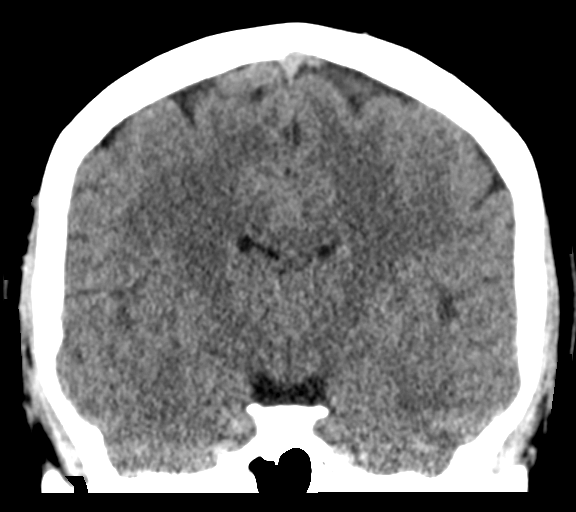

[Series 5: sagittal soft · sagittal · 0.27mm/px · 3 of 52 slices shown]
[im 18/52  brain]
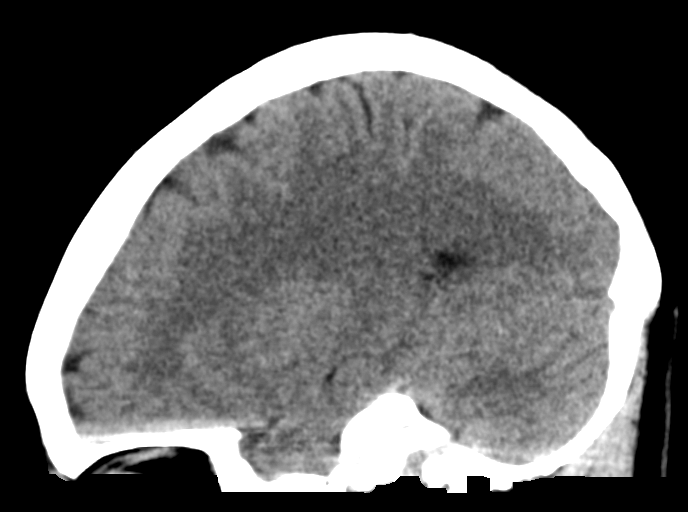
[im 26/52  brain]
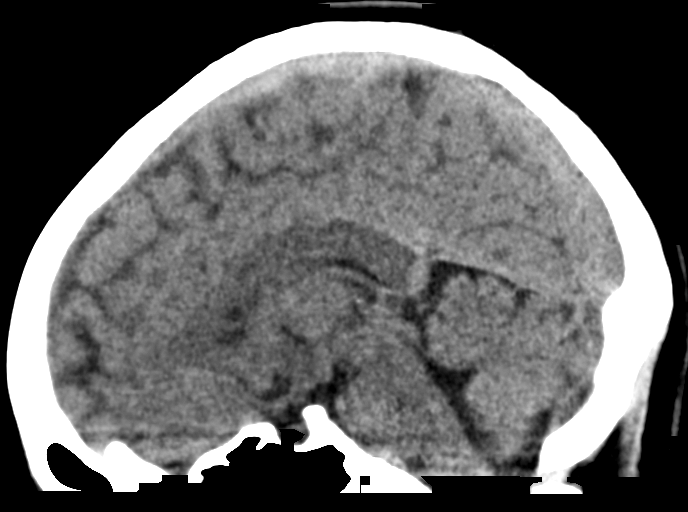
[im 35/52  brain]
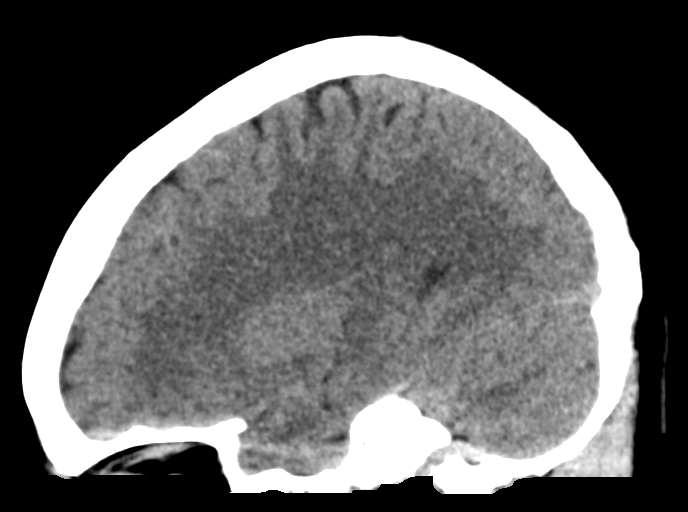

[16 of 47 positions shown; findings below may reference images not displayed]

FINDINGS: Brain: No acute intracranial abnormality. Specifically, no
hemorrhage, hydrocephalus, mass lesion, acute infarction, or
significant intracranial injury.

Vascular: No hyperdense vessel or unexpected calcification.

Skull: No acute calvarial abnormality.

Sinuses/Orbits: No acute findings

Other: None
IMPRESSION: Normal study.

## 2023-10-04 ENCOUNTER — Ambulatory Visit (HOSPITAL_BASED_OUTPATIENT_CLINIC_OR_DEPARTMENT_OTHER)
Admission: RE | Admit: 2023-10-04 | Discharge: 2023-10-04 | Disposition: A | Discharge status: Home / Self Care | Source: Ambulatory Visit | Attending: Family Medicine | Admitting: Family Medicine

## 2023-10-04 DIAGNOSIS — R1011 Right upper quadrant pain: Secondary | ICD-10-CM | POA: Diagnosis present

## 2023-10-06 ENCOUNTER — Telehealth: Payer: Self-pay | Admitting: *Deleted

## 2023-10-06 NOTE — Telephone Encounter (Signed)
 Copied from CRM [id number]. Topic: Clinical - Lab/Test Results >> Oct 06, 2023 11:20 AM Berneda FALCON wrote: Reason for CRM: Patient would like someone to call her to go over ultrasound results together please.  Patient callback is [phone] (home)

## 2023-10-07 ENCOUNTER — Ambulatory Visit: Payer: Self-pay | Admitting: Family Medicine

## 2023-10-07 NOTE — Telephone Encounter (Signed)
Patient informed of message below and voiced understanding

## 2023-11-16 ENCOUNTER — Emergency Department (HOSPITAL_BASED_OUTPATIENT_CLINIC_OR_DEPARTMENT_OTHER)
Admission: EM | Admit: 2023-11-16 | Discharge: 2023-11-16 | Disposition: A | Discharge status: Home / Self Care | Payer: Self-pay | Attending: Emergency Medicine | Admitting: Emergency Medicine

## 2023-11-16 ENCOUNTER — Encounter (HOSPITAL_BASED_OUTPATIENT_CLINIC_OR_DEPARTMENT_OTHER): Payer: Self-pay | Admitting: Emergency Medicine

## 2023-11-16 ENCOUNTER — Emergency Department (HOSPITAL_BASED_OUTPATIENT_CLINIC_OR_DEPARTMENT_OTHER): Payer: Self-pay

## 2023-11-16 ENCOUNTER — Other Ambulatory Visit: Payer: Self-pay

## 2023-11-16 DIAGNOSIS — R1084 Generalized abdominal pain: Secondary | ICD-10-CM | POA: Insufficient documentation

## 2023-11-16 LAB — CBC WITH DIFFERENTIAL/PLATELET
Abs Immature Granulocytes: 0.01 K/uL (ref 0.00–0.07)
Basophils Absolute: 0 K/uL (ref 0.0–0.1)
Basophils Relative: 0 %
Eosinophils Absolute: 0.4 K/uL (ref 0.0–0.5)
Eosinophils Relative: 6 %
HCT: 41.3 % (ref 36.0–46.0)
Hemoglobin: 14.2 g/dL (ref 12.0–15.0)
Immature Granulocytes: 0 %
Lymphocytes Relative: 39 %
Lymphs Abs: 2.5 K/uL (ref 0.7–4.0)
MCH: 30.9 pg (ref 26.0–34.0)
MCHC: 34.4 g/dL (ref 30.0–36.0)
MCV: 89.8 fL (ref 80.0–100.0)
Monocytes Absolute: 0.7 K/uL (ref 0.1–1.0)
Monocytes Relative: 10 %
Neutro Abs: 2.9 K/uL (ref 1.7–7.7)
Neutrophils Relative %: 45 %
Platelets: 256 K/uL (ref 150–400)
RBC: 4.6 MIL/uL (ref 3.87–5.11)
RDW: 12 % (ref 11.5–15.5)
WBC: 6.5 K/uL (ref 4.0–10.5)
nRBC: 0 % (ref 0.0–0.2)

## 2023-11-16 LAB — URINALYSIS, ROUTINE W REFLEX MICROSCOPIC
Bilirubin Urine: NEGATIVE
Glucose, UA: NEGATIVE mg/dL
Ketones, ur: 15 mg/dL — AB
Nitrite: NEGATIVE
Specific Gravity, Urine: 1.032 — ABNORMAL HIGH (ref 1.005–1.030)
pH: 5.5 (ref 5.0–8.0)

## 2023-11-16 LAB — PREGNANCY, URINE: Preg Test, Ur: NEGATIVE

## 2023-11-16 LAB — COMPREHENSIVE METABOLIC PANEL WITH GFR
ALT: 14 U/L (ref 0–44)
AST: 15 U/L (ref 15–41)
Albumin: 4.5 g/dL (ref 3.5–5.0)
Alkaline Phosphatase: 69 U/L (ref 38–126)
Anion gap: 13 (ref 5–15)
BUN: 11 mg/dL (ref 6–20)
CO2: 21 mmol/L — ABNORMAL LOW (ref 22–32)
Calcium: 9.8 mg/dL (ref 8.9–10.3)
Chloride: 105 mmol/L (ref 98–111)
Creatinine, Ser: 0.89 mg/dL (ref 0.44–1.00)
GFR, Estimated: >60 mL/min (ref >60)
Glucose, Bld: 99 mg/dL (ref 70–99)
Potassium: 4 mmol/L (ref 3.5–5.1)
Sodium: 139 mmol/L (ref 135–145)
Total Bilirubin: 0.8 mg/dL (ref 0.0–1.2)
Total Protein: 7.2 g/dL (ref 6.5–8.1)

## 2023-11-16 LAB — LIPASE, BLOOD: Lipase: 25 U/L (ref 11–51)

## 2023-11-16 MED ORDER — SODIUM CHLORIDE 0.9 % IV BOLUS
1000.0000 mL | Freq: Once | INTRAVENOUS | Status: AC
  Administered 2023-11-16: 1000 mL via INTRAVENOUS

## 2023-11-16 MED ORDER — IOHEXOL 300 MG/ML  SOLN
100.0000 mL | Freq: Once | INTRAMUSCULAR | Status: AC | PRN
  Administered 2023-11-16: 100 mL via INTRAVENOUS

## 2023-11-16 NOTE — ED Triage Notes (Signed)
 C/o upper abd pain x 2 weeks ago. Endorses some constipation. Denies fevers.

## 2023-11-16 NOTE — Discharge Instructions (Signed)
 As we discussed, your workup in the ER today was reassuring for acute findings.  Laboratory evaluation and CT imaging did not reveal any emergent cause of your symptoms.  I do recommend that you call your PCP to schedule close follow-up appointment.  Return if development of any new or worsening symptoms.

## 2023-11-16 NOTE — ED Provider Notes (Signed)
 [place] EMERGENCY DEPARTMENT AT [facility] Provider Note   CSN: [id number] Arrival date & time: 11/16/23  9143     Patient presents with: Abdominal Pain   Kimberly Nichols is a 23 y.o. female.   Patient with no prior past medical history presents today with complaints of abdominal pain.  She reports that her pain has been off-and-on for the last 6 months.  Pain is generalized throughout her abdomen.  Does report that her pain seems to be worsening over the last few days.  Reports she has been seen and evaluated for this previously without significant findings.  She had an ultrasound of her gallbladder at some point that was negative as well.  She was told to take Pepcid  for management of her symptoms which she did and did not have any improvement with this.  She notes that she had 1 episode of nausea and vomiting 2-1/2 weeks ago, none since then.  Denies any fevers or chills.  No history of abdominal surgeries.  Does reports she has had some new issues with constipation, she took a few days of MiraLAX and did have a few stools but still felt like she needed to have further bowel movements. Patient does note that she is currently on her menstrual cycle which is normal time for her.  The history is provided by the patient. No language interpreter was used.  Abdominal Pain      Prior to Admission medications   Medication Sig Start Date End Date Taking? Authorizing Provider  ondansetron  (ZOFRAN -ODT) 4 MG disintegrating tablet Take 1 tablet (4 mg total) by mouth every 8 (eight) hours as needed for nausea or vomiting. 06/28/23   Alba Sharper, MD  sertraline  (ZOLOFT ) 50 MG tablet Take 1 tablet (50 mg total) by mouth daily. 08/18/22   Burchette, Wolm ORN, MD    Allergies: Clarithromycin    Review of Systems  Gastrointestinal:  Positive for abdominal pain.  All other systems reviewed and are negative.   Updated Vital Signs BP 128/89 (BP Location: Right Arm)   Pulse 90    Temp 98.9 F (37.2 C) (Oral)   Resp 18   SpO2 99%   Physical Exam Vitals and nursing note reviewed.  Constitutional:      General: She is not in acute distress.    Appearance: Normal appearance. She is normal weight. She is not ill-appearing, toxic-appearing or diaphoretic.  HENT:     Head: Normocephalic and atraumatic.  Cardiovascular:     Rate and Rhythm: Normal rate.  Pulmonary:     Effort: Pulmonary effort is normal. No respiratory distress.  Abdominal:     General: Abdomen is flat.     Palpations: Abdomen is soft.     Tenderness: There is generalized abdominal tenderness. There is no guarding or rebound.  Musculoskeletal:        General: Normal range of motion.     Cervical back: Normal range of motion.  Skin:    General: Skin is warm and dry.  Neurological:     General: No focal deficit present.     Mental Status: She is alert.  Psychiatric:        Mood and Affect: Mood normal.        Behavior: Behavior normal.     (all labs ordered are listed, but only abnormal results are displayed) Labs Reviewed  COMPREHENSIVE METABOLIC PANEL WITH GFR - Abnormal; Notable for the following components:      Result Value  CO2 21 (*)    All other components within normal limits  URINALYSIS, ROUTINE W REFLEX MICROSCOPIC - Abnormal; Notable for the following components:   APPearance HAZY (*)    Specific Gravity, Urine 1.032 (*)    Hgb urine dipstick MODERATE (*)    Ketones, ur 15 (*)    Protein, ur TRACE (*)    Leukocytes,Ua TRACE (*)    Bacteria, UA RARE (*)    All other components within normal limits  LIPASE, BLOOD  CBC WITH DIFFERENTIAL/PLATELET  PREGNANCY, URINE    EKG: None  Radiology: CT ABDOMEN PELVIS W CONTRAST Result Date: 11/16/2023 CLINICAL DATA:  Abdominal pain, acute, nonlocalized. EXAM: CT ABDOMEN AND PELVIS WITH CONTRAST TECHNIQUE: Multidetector CT imaging of the abdomen and pelvis was performed using the standard protocol following bolus administration  of intravenous contrast. RADIATION DOSE REDUCTION: This exam was performed according to the departmental dose-optimization program which includes automated exposure control, adjustment of the mA and/or kV according to patient size and/or use of iterative reconstruction technique. CONTRAST:  100mL OMNIPAQUE IOHEXOL 300 MG/ML  SOLN COMPARISON:  None Available. FINDINGS: Lower chest: Lung bases are clear. Hepatobiliary: Normal appearance of the liver, gallbladder and portal venous system. No biliary dilatation. Pancreas: Unremarkable. No pancreatic ductal dilatation or surrounding inflammatory changes. Spleen: Normal in size without focal abnormality. Adrenals/Urinary Tract: Normal adrenal glands. Normal appearance of both kidneys without hydronephrosis. No suspicious renal lesions. Urinary bladder is decompressed. Stomach/Bowel: Normal appearance of the stomach. No bowel dilatation or obstruction. No focal bowel inflammation. There appears to be a small appendix but poorly characterized. Vascular/Lymphatic: No significant vascular findings are present. No enlarged abdominal or pelvic lymph nodes. Reproductive: Small amount of pelvic free fluid in the anterior and posterior aspect of the pelvis. The free fluid is low-density. Normal appearance of the uterus and left adnexa. Fullness in the right adnexal area and there is probably a right-sided corpus luteum. Uterus is retroverted. Other: Low-density free fluid in the pelvis.  Negative for free air. Musculoskeletal: No acute bone abnormality. IMPRESSION: 1. Small amount of low-density fluid in the pelvis. This could be physiologic. Difficult to exclude a ruptured ovarian cyst on the right side. Recommend clinical correlation in this area. 2. No other acute abnormality in the abdomen or pelvis. Electronically Signed   By: Juliene Balder M.D.   On: 11/16/2023 11:05     Procedures   Medications Ordered in the ED - No data to display                                   Medical Decision Making Amount and/or Complexity of Data Reviewed Labs: ordered. Radiology: ordered.   This patient is a 23 y.o. female who presents to the ED for concern of abdominal pain, this involves an extensive number of treatment options, and is a complaint that carries with it a high risk of complications and morbidity. The emergent differential diagnosis prior to evaluation includes, but is not limited to, constipation, dysmenorrhea, AAA, gastroenteritis, appendicitis, Bowel obstruction, Bowel perforation. Gastroparesis, DKA, Hernia, Inflammatory bowel disease, mesenteric ischemia, pancreatitis, peritonitis SBP, volvulus.   This is not an exhaustive differential.   Past Medical History / Co-morbidities / Social History:  has a past medical history of Abdominal pain, recurrent, Allergy, Anxiety, Depression, Hay fever, and Helicobacter pylori antibody above reference range.  Additional history: Chart reviewed. Pertinent results include: patient had US  abdomen on 10/06/23 which  was unremarkable  Physical Exam: Physical exam performed. The pertinent findings include: Mild generalized abdominal TTP without rebound or guarding.  Lab Tests: I ordered, and personally interpreted labs.  The pertinent results include: UA with RBCs consistent with patient's menstrual status.  Noninfectious.  Did have ketones and protein, could be related to dehydration.  No other acute laboratory abnormalities.   Imaging Studies: I ordered imaging studies including CT abdomen pelvis. I independently visualized and interpreted imaging which showed   1. Small amount of low-density fluid in the pelvis. This could be physiologic. Difficult to exclude a ruptured ovarian cyst on the right side. Recommend clinical correlation in this area. 2. No other acute abnormality in the abdomen or pelvis.  I agree with the radiologist interpretation.   Medications: I ordered medication including IV fluids  for  dehydration.  Offered pain/nausea medicine which patient declined.  Reevaluation of the patient after these medicines showed that the patient improved. I have reviewed the patients home medicines and have made adjustments as needed.  Disposition: After consideration of the diagnostic results and the patients response to treatment, I feel that emergency department workup does not suggest an emergent condition requiring admission or immediate intervention beyond what has been performed at this time. The plan is: discharge with close outpatient pcp follow-up and return precautions. Patients work-up is benign, she feels well and ready to go home.  Her CT did show findings consistent with ovarian cyst rupture which I discussed with the patient, however given the length of duration of her symptoms and the location of her pain, does seem inconsistent with cyst rupture.  No findings or symptoms concerning for ovarian torsion to warrant further evaluation with ultrasound imaging at this time.  Discussed same with patient is understanding and in agreement with this. Evaluation and diagnostic testing in the emergency department does not suggest an emergent condition requiring admission or immediate intervention beyond what has been performed at this time.  Plan for discharge with close PCP follow-up.  Patient is understanding and amenable with plan, educated on red flag symptoms that would prompt immediate return.  Patient discharged in stable condition.  Final diagnoses:  Generalized abdominal pain    ED Discharge Orders     None     An After Visit Summary was printed and given to the patient.      Shawnta Zimbelman A, PA-C 11/16/23 1125    Towana Ozell BROCKS, MD 11/16/23 [phone]

## 2023-11-18 ENCOUNTER — Ambulatory Visit: Payer: Self-pay | Admitting: Family Medicine
